# Patient Record
Sex: Female | Born: 1995 | Race: White | Hispanic: No | Marital: Single | Smoking: Never smoker
Health system: Southern US, Community
[De-identification: ages and names within clinical notes are randomized; demographics above are authoritative.]

## PROBLEM LIST (undated history)

## (undated) DIAGNOSIS — K529 Noninfective gastroenteritis and colitis, unspecified: Secondary | ICD-10-CM

## (undated) HISTORY — PX: KNEE SURGERY: SHX244

---
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
## 2016-03-11 ENCOUNTER — Other Ambulatory Visit: Payer: Self-pay | Admitting: Physician Assistant

## 2017-06-16 ENCOUNTER — Other Ambulatory Visit: Payer: Self-pay

## 2017-06-16 ENCOUNTER — Ambulatory Visit (HOSPITAL_COMMUNITY)
Admission: EM | Admit: 2017-06-16 | Discharge: 2017-06-16 | Disposition: A | Discharge status: Home / Self Care | Payer: 59 | Attending: Family Medicine | Admitting: Family Medicine

## 2017-06-16 ENCOUNTER — Ambulatory Visit (INDEPENDENT_AMBULATORY_CARE_PROVIDER_SITE_OTHER): Payer: 59

## 2017-06-16 ENCOUNTER — Encounter (HOSPITAL_COMMUNITY): Payer: Self-pay | Admitting: Emergency Medicine

## 2017-06-16 DIAGNOSIS — R05 Cough: Secondary | ICD-10-CM

## 2017-06-16 DIAGNOSIS — J069 Acute upper respiratory infection, unspecified: Secondary | ICD-10-CM | POA: Diagnosis not present

## 2017-06-16 DIAGNOSIS — R059 Cough, unspecified: Secondary | ICD-10-CM

## 2017-06-16 MED ORDER — AZITHROMYCIN 250 MG PO TABS: 250.0000 mg | ORAL_TABLET | Freq: Every day | ORAL | 0 refills | Status: DC

## 2017-06-16 NOTE — ED Triage Notes (Signed)
Symptoms for 2 weeks.  Seen by pcp, took antibiotics for 10 day.  She never felt well.  She was taking amoxicillin.  Patient finished them 2 days ago and symptoms are worsening.  Patient has congested nose, headache, ears hurting, and sore throat.

## 2017-06-18 ENCOUNTER — Other Ambulatory Visit: Payer: Self-pay

## 2017-06-18 ENCOUNTER — Ambulatory Visit: Payer: 59 | Admitting: Family Medicine

## 2017-06-18 ENCOUNTER — Encounter: Payer: Self-pay | Admitting: Family Medicine

## 2017-06-18 VITALS — BP 116/72 | HR 122 | Temp 97.6°F | Resp 16 | Ht 69.69 in | Wt 319.0 lb

## 2017-06-18 DIAGNOSIS — J310 Chronic rhinitis: Secondary | ICD-10-CM

## 2017-06-18 MED ORDER — CETIRIZINE HCL 10 MG PO TABS: 10.0000 mg | ORAL_TABLET | Freq: Every day | ORAL | 11 refills | Status: AC

## 2017-06-18 MED ORDER — PSEUDOEPHEDRINE HCL ER 120 MG PO TB12: 120.0000 mg | ORAL_TABLET | Freq: Two times a day (BID) | ORAL | 1 refills | Status: AC

## 2017-06-18 NOTE — Patient Instructions (Addendum)
1. Use flonase twice a day 2. Use zyrtec at bedtime 3. Use humidifier    IF you received an x-ray today, you will receive an invoice from [facility]. Please contact [facility] at [phone] with questions or concerns regarding your invoice.   IF you received labwork today, you will receive an invoice from [facility]. Please contact [facility] at [phone] with questions or concerns regarding your invoice.   Our billing staff will not be able to assist you with questions regarding bills from these companies.  You will be contacted with the lab results as soon as they are available. The fastest way to get your results is to activate your My Chart account. Instructions are located on the last page of this paperwork. If you have not heard from us regarding the results in 2 weeks, please contact this office.

## 2017-06-18 NOTE — Progress Notes (Signed)
   11/16/201812:59 PM  Alicia Frederick 11/02/1995, 21 y.o. female [id number]  Chief Complaint  Patient presents with  . Cough    pt states she has been having a Chronic cough for two weeks   . Nasal Congestion    nasal drainage  with some ear pain     HPI:   Patient is a 21 y.o. female who presents today for nasal congestion, PND and cough, normally worse at night for about 2 weeks that is not getting better.  She has been seen at multiple clinics/[facility] 11/1 11/2 11/6 - treated with amoxcillin for right AOM, reports ear pain completely resolved since then. 11/14 - CXR was normal  Patient reports that she tried flonase once a day for the first week. She has continued to use robitussin and mucinex without much improvement.  Depression screen PHQ 2/9 06/18/2017  Decreased Interest 0  Down, Depressed, Hopeless 0  PHQ - 2 Score 0    No Known Allergies  Prior to Admission medications   Medication Sig Start Date End Date Taking? Authorizing Provider    History reviewed. No pertinent past medical history.  Past Surgical History:  Procedure Laterality Date  . KNEE SURGERY      Social History   Tobacco Use  . Smoking status: Never Smoker  . Smokeless tobacco: Never Used  Substance Use Topics  . Alcohol use: No    Frequency: Never    History reviewed. No pertinent family history.  Review of Systems  Constitutional: Negative for chills and fever.  HENT: Positive for congestion. Negative for ear pain, sinus pain and sore throat.   Respiratory: Positive for cough. Negative for shortness of breath.   Cardiovascular: Negative for chest pain, palpitations and leg swelling.  Gastrointestinal: Negative for abdominal pain, nausea and vomiting.     OBJECTIVE:  Blood pressure 116/72, pulse (!) 122, temperature 97.6 F (36.4 C), temperature source Oral, resp. rate 16, height 5' 9.69" (1.77 m), weight (!) 319 lb (144.7 kg), SpO2 96 %.  Physical Exam  Constitutional: She is  oriented to person, place, and time and well-developed, well-nourished, and in no distress.  HENT:  Head: Normocephalic and atraumatic.  Right Ear: Hearing, tympanic membrane, external ear and ear canal normal.  Left Ear: Hearing, tympanic membrane, external ear and ear canal normal.  Mouth/Throat: Oropharynx is clear and moist.  Eyes: EOM are normal. Pupils are equal, round, and reactive to light.  Neck: Neck supple.  Cardiovascular: Normal rate, regular rhythm and normal heart sounds. Exam reveals no gallop and no friction rub.  No murmur heard. Pulmonary/Chest: Effort normal and breath sounds normal. She has no wheezes. She has no rales.  Lymphadenopathy:    She has no cervical adenopathy.  Neurological: She is alert and oriented to person, place, and time. Gait normal.  Skin: Skin is warm and dry.    ASSESSMENT and PLAN  1. Rhinitis, unspecified type Discussed supportive measures, restart flonase, new meds r/se/b and RTC precautions. Patient educational handout given. - cetirizine (ZYRTEC) 10 MG tablet; Take 1 tablet (10 mg total) daily by mouth. - pseudoephedrine (SUDAFED 12 HOUR) 120 MG 12 hr tablet; Take 1 tablet (120 mg total) 2 (two) times daily by mouth.  Return if symptoms worsen or fail to improve.    Myles LippsIrma M Santiago, MD [facility] [address] [place], [place] [address] Ph.  [phone] Fax [phone]

## 2017-06-21 NOTE — ED Provider Notes (Signed)
  [facility]   [id number] 06/16/17 Arrival Time: 1338  ASSESSMENT & PLAN:  1. Cough   2. Acute upper respiratory infection     Meds ordered this encounter  Medications  . azithromycin (ZITHROMAX) 250 MG tablet    Sig: Take 1 tablet (250 mg total) daily by mouth. Take first 2 tablets together, then 1 every day until finished.    Dispense:  6 tablet    Refill:  0    OTC symptom care as needed. May f/u if not improving over the next week.  Reviewed expectations re: course of current medical issues. Questions answered. Outlined signs and symptoms indicating need for more acute intervention. Patient verbalized understanding. After Visit Summary given.   SUBJECTIVE:  Alicia Frederick is a 21 y.o. female who presents with complaint of nasal congestion, post-nasal drainage, and a persistent cough. Onset abrupt approximately 2 weeks ago. Finished course of amoxicillin. Overall fatigued. SOB: none. Wheezing: none. Cough is bothering her the most. Non-smoker. OTC treatment: cough medication with mild help.  ROS: As per HPI.   OBJECTIVE:  Vitals:   06/16/17 1354  BP: 137/90  Pulse: (!) 115  Resp: (!) 22  Temp: 98.2 F (36.8 C)  TempSrc: Oral  SpO2: 97%     General appearance: alert; no distress HEENT: nasal congestion; clear runny nose; throat irritation secondary to post-nasal drainage Neck: supple without LAD Lungs: clear to auscultation bilaterally; active dry cough Skin: warm and dry Psychological: alert and cooperative; normal mood and affect  Dg Chest 2 View  Result Date: 06/16/2017 CLINICAL DATA:  Cough and shortness of breath EXAM: CHEST  2 VIEW COMPARISON:  None. FINDINGS: Normal heart size and mediastinal contours. No acute infiltrate or edema. No effusion or pneumothorax. No acute osseous findings. IMPRESSION: Negative chest. Electronically Signed   By: Marnee SpringJonathon  Watts M.D.   On: 06/16/2017 14:36    No Known Allergies   Social History    Socioeconomic History  . Marital status: Single    Spouse name: Not on file  . Number of children: Not on file  . Years of education: Not on file  . Highest education level: Not on file  Social Needs  . Financial resource strain: Not on file  . Food insecurity - worry: Not on file  . Food insecurity - inability: Not on file  . Transportation needs - medical: Not on file  . Transportation needs - non-medical: Not on file  Occupational History  . Not on file  Tobacco Use  . Smoking status: Never Smoker  . Smokeless tobacco: Never Used  Substance and Sexual Activity  . Alcohol use: No    Frequency: Never  . Drug use: No  . Sexual activity: Not on file  Other Topics Concern  . Not on file  Social History Narrative  . Not on file           Mardella LaymanHagler, Mona Ayars, MD 06/21/17 1120

## 2018-04-07 ENCOUNTER — Other Ambulatory Visit: Payer: Self-pay

## 2018-04-07 ENCOUNTER — Emergency Department (HOSPITAL_COMMUNITY): Payer: 59

## 2018-04-07 ENCOUNTER — Emergency Department (HOSPITAL_COMMUNITY)
Admission: EM | Admit: 2018-04-07 | Discharge: 2018-04-07 | Disposition: A | Discharge status: Home / Self Care | Payer: 59 | Attending: Emergency Medicine | Admitting: Emergency Medicine

## 2018-04-07 DIAGNOSIS — R519 Headache, unspecified: Secondary | ICD-10-CM

## 2018-04-07 DIAGNOSIS — R040 Epistaxis: Secondary | ICD-10-CM | POA: Diagnosis not present

## 2018-04-07 DIAGNOSIS — R51 Headache: Secondary | ICD-10-CM | POA: Insufficient documentation

## 2018-04-07 LAB — CBC WITH DIFFERENTIAL/PLATELET
ABS IMMATURE GRANULOCYTES: 0 10*3/uL (ref 0.0–0.1)
BASOS PCT: 1 %
Basophils Absolute: 0.1 10*3/uL (ref 0.0–0.1)
Eosinophils Absolute: 0.2 10*3/uL (ref 0.0–0.7)
Eosinophils Relative: 3 %
HEMATOCRIT: 41.1 % (ref 36.0–46.0)
HEMOGLOBIN: 13 g/dL (ref 12.0–15.0)
IMMATURE GRANULOCYTES: 0 %
LYMPHS ABS: 2.1 10*3/uL (ref 0.7–4.0)
Lymphocytes Relative: 25 %
MCH: 25.6 pg — AB (ref 26.0–34.0)
MCHC: 31.6 g/dL (ref 30.0–36.0)
MCV: 80.9 fL (ref 78.0–100.0)
MONO ABS: 0.5 10*3/uL (ref 0.1–1.0)
MONOS PCT: 6 %
NEUTROS ABS: 5.5 10*3/uL (ref 1.7–7.7)
Neutrophils Relative %: 65 %
PLATELETS: 300 10*3/uL (ref 150–400)
RBC: 5.08 MIL/uL (ref 3.87–5.11)
RDW: 13.4 % (ref 11.5–15.5)
WBC: 8.4 10*3/uL (ref 4.0–10.5)

## 2018-04-07 LAB — COMPREHENSIVE METABOLIC PANEL
ALBUMIN: 3.4 g/dL — AB (ref 3.5–5.0)
ALK PHOS: 64 U/L (ref 38–126)
ALT: 35 U/L (ref 0–44)
ANION GAP: 9 (ref 5–15)
AST: 23 U/L (ref 15–41)
BUN: 10 mg/dL (ref 6–20)
CALCIUM: 8.9 mg/dL (ref 8.9–10.3)
CHLORIDE: 107 mmol/L (ref 98–111)
CO2: 21 mmol/L — AB (ref 22–32)
CREATININE: 0.67 mg/dL (ref 0.44–1.00)
GFR calc Af Amer: >60 mL/min (ref >60)
GFR calc non Af Amer: >60 mL/min (ref >60)
GLUCOSE: 132 mg/dL — AB (ref 70–99)
Potassium: 4 mmol/L (ref 3.5–5.1)
SODIUM: 137 mmol/L (ref 135–145)
Total Bilirubin: 0.8 mg/dL (ref 0.3–1.2)
Total Protein: 6.8 g/dL (ref 6.5–8.1)

## 2018-04-07 LAB — I-STAT BETA HCG BLOOD, ED (MC, WL, AP ONLY)

## 2018-04-07 LAB — PROTIME-INR
INR: 1.09
Prothrombin Time: 14 seconds (ref 11.4–15.2)

## 2018-04-07 MED ORDER — IOPAMIDOL (ISOVUE-370) INJECTION 76%
50.0000 mL | Freq: Once | INTRAVENOUS | Status: AC | PRN
  Administered 2018-04-07: 50 mL via INTRAVENOUS

## 2018-04-07 MED ORDER — IOPAMIDOL (ISOVUE-370) INJECTION 76%
INTRAVENOUS | Status: AC
  Filled 2018-04-07: qty 50

## 2018-04-07 NOTE — ED Triage Notes (Signed)
Formatting of this note might be different from the original.  Pt states for the 3 last week she has been having intermittent nose bleeds and has seen an ENT. Pt states now she has started having headaches after it bleeds. Currently is not bleeding.   Electronically signed by Mariea Stable, RN at 04/07/2018 11:05 AM EDT

## 2018-04-07 NOTE — ED Notes (Signed)
Formatting of this note might be different from the original.  Patient transported to CT  Electronically signed by Parke Simmers, Pollyann Savoy, RN at 04/07/2018 12:44 PM EDT

## 2018-04-07 NOTE — ED Provider Notes (Signed)
Formatting of this note is different from the original.  Images from the original note were not included.    [facility] EMERGENCY DEPARTMENT  Provider Note    CSN: [id number]  Arrival date & time: 04/07/18  1054    History    Chief Complaint  Chief Complaint   Patient presents with   ? Epistaxis     HPI  Cristina Rios is a 22 y.o. female.    Cristina Rios is a 22 y.o. Female who is otherwise healthy, presents to the emergency department for evaluation of intermittent nosebleeds.  For the last 2 to 3 weeks patient has been having daily nosebleeds in the morning when she wakes up, it seems to alternate between sides.  Patient reports for the past 5 days she has begun having severe headaches after the nosebleed that lasted about an hour.  No prior history of headaches.  She reports the headache is across the front of her face and is a constant throbbing ache.  She denies associated visual changes, facial asymmetry or weakness, difficulty speaking or swallowing and no numbness weakness or tingling in her arms or legs.  No neck pain.  No fevers.  Patient has been seen by urgent care, her primary care doctor and ENT regarding these nosebleeds had lab work done initially which showed normal hemoglobin and platelet count, she has had cautery of the septum performed on 2 occasions first at urgent care and then with ENT, since then she has been able to control her nosebleeds with Afrin-soaked cotton balls but nosebleeds have persisted since cautery and she is now developed these headaches.  Attempted to call the ENT doctor she saw previously but they were unable to see her today and recommended coming to the ED for further evaluation given new symptoms.  She is not on any other new medications, no history of other bruising or bleeding issues.  She has not had any head imaging since symptoms began.    No past medical history on file.    There are no active problems to display for this patient.    Past Surgical  History:   Procedure Laterality Date   ? KNEE SURGERY         OB History    None      Home Medications      Prior to Admission medications    Medication Sig Start Date End Date Taking? Authorizing Provider   cetirizine (ZYRTEC) 10 MG tablet Take 1 tablet (10 mg total) daily by mouth. 06/18/17   Myles Lipps, MD   pseudoephedrine (SUDAFED 12 HOUR) 120 MG 12 hr tablet Take 1 tablet (120 mg total) 2 (two) times daily by mouth. 06/18/17   Myles Lipps, MD     Family History  No family history on file.    Social History  Social History     Tobacco Use   ? Smoking status: Never Smoker   ? Smokeless tobacco: Never Used   Substance Use Topics   ? Alcohol use: No     Frequency: Never   ? Drug use: No     Allergies    Patient has no known allergies.    Review of Systems  Review of Systems   Constitutional: Negative for chills and fever.   HENT: Positive for nosebleeds.    Eyes: Negative for photophobia, pain, redness and visual disturbance.   Respiratory: Negative for cough and shortness of breath.    Cardiovascular: Negative  for chest pain.   Gastrointestinal: Negative for abdominal pain, nausea and vomiting.   Musculoskeletal: Negative for neck pain and neck stiffness.   Skin: Negative for color change, pallor and wound.   Neurological: Positive for headaches. Negative for dizziness, tremors, seizures, syncope, facial asymmetry, speech difficulty, weakness, light-headedness and numbness.   Hematological: Negative for adenopathy. Does not bruise/bleed easily.   All other systems reviewed and are negative.    Physical Exam  Updated Vital Signs  BP (!) 169/77 (BP Location: Right Arm)   Pulse 92   Temp 98.7 F (37.1 C) (Oral)   Resp 17   SpO2 94%     Physical Exam   Constitutional: She is oriented to person, place, and time. She appears well-developed and well-nourished. No distress.   HENT:   Head: Normocephalic and atraumatic.   Bilateral nares are patent without significant mucosal edema, no active bleeding and  no clear area of prior bleeding.  Posterior oropharynx is clear, no bloody drainage noted.  Lateral TMs clear with good light reflection.   Eyes: Right eye exhibits no discharge. Left eye exhibits no discharge.   Neck: Neck supple.   Cardiovascular: Normal rate, regular rhythm, normal heart sounds and intact distal pulses.   Pulmonary/Chest: Effort normal and breath sounds normal. No respiratory distress.   Respirations equal and unlabored, patient able to speak in full sentences, lungs clear to auscultation bilaterally   Musculoskeletal: She exhibits no edema or deformity.   Lymphadenopathy:     She has no cervical adenopathy.   Neurological: She is alert and oriented to person, place, and time. Coordination normal.   Speech is clear, able to follow commands  CN III-XII intact  Normal strength in upper and lower extremities bilaterally including dorsiflexion and plantar flexion, strong and equal grip strength  Sensation normal to light and sharp touch  Moves extremities without ataxia, coordination intact  Normal finger to nose and rapid alternating movements  No pronator drift   Skin: Skin is warm and dry. Capillary refill takes less than 2 seconds. She is not diaphoretic.   Psychiatric: She has a normal mood and affect. Her behavior is normal.   Nursing note and vitals reviewed.    ED Treatments / Results   Labs  (all labs ordered are listed, but only abnormal results are displayed)  Labs Reviewed   COMPREHENSIVE METABOLIC PANEL - Abnormal; Notable for the following components:       Result Value    CO2 21 (*)     Glucose, Bld 132 (*)     Albumin 3.4 (*)     All other components within normal limits   CBC WITH DIFFERENTIAL/PLATELET - Abnormal; Notable for the following components:    MCH 25.6 (*)     All other components within normal limits   PROTIME-INR   I-STAT BETA HCG BLOOD, ED ([facility], [facility], [facility] ONLY)     EKG  None    Radiology  Ct Angio Head W Or Wo Contrast    Result Date: 04/07/2018  CLINICAL DATA:  22 year old  female with recurrent epistaxis despite two nasal septal cauterizations recently. Daily nose bleeds now and new onset associated headache. EXAM: CT ANGIOGRAPHY HEAD AND NECK TECHNIQUE: Multidetector CT imaging of the head and neck was performed using the standard protocol during bolus administration of intravenous contrast. Multiplanar CT image reconstructions and MIPs were obtained to evaluate the vascular anatomy. Carotid stenosis measurements (when applicable) are obtained utilizing NASCET criteria, using the distal internal  carotid diameter as the denominator. CONTRAST:  50mL ISOVUE-370 IOPAMIDOL (ISOVUE-370) INJECTION 76% COMPARISON:  None. FINDINGS: CT HEAD Brain: No midline shift, ventriculomegaly, mass effect, evidence of mass lesion, intracranial hemorrhage or evidence of cortically based acute infarction. Gray-white matter differentiation is within normal limits throughout the brain. Normal cerebral volume. Calvarium and skull base: Intact. Paranasal sinuses: Mild bilateral maxillary sinus and anterior ethmoid mucosal thickening, otherwise clear. No sinus fluid levels. Orbits: Visualized orbits and scalp soft tissues are within normal limits. CTA NECK Skeleton: Negative. Upper chest: Large body habitus. Negative upper lungs aside from mild atelectasis. Negative visible mediastinum. Other neck: Negative thyroid, larynx, pharynx, parapharyngeal spaces, retropharyngeal space, sublingual space, submandibular and parotid spaces. The masticator spaces likewise appear normal. Bilateral cervical lymph nodes are within normal limits. The nasal septum is intact with mild rightward septal deviation. No septal mass or lesion is evident. There is mild symmetric appearing bilateral nasal cavity mucosal thickening with otherwise normal nasal cavity pneumatization. No abnormal nasal cavity enhancement, no abnormal nasal or facial vessels are identified. The bilateral pterygoid palatine fossa appear symmetric and normal.  Incidental left nose piercing involving the piriform aperture, not the septum. Aortic arch: Normal 3 vessel arch configuration. No atherosclerosis or great vessel origin stenosis. Right carotid system: Negative aside from tortuosity of the right ICA at the C2 level. The right ECA branches appear within normal limits. Left carotid system: Negative aside from mild left ICA tortuosity at the C2 level. The left ECA branches appear within normal limits. Vertebral arteries: Normal proximal right subclavian artery and right vertebral artery origin. Normal right vertebral artery to the skull base. Normal proximal left subclavian artery and left vertebral artery origin. The left vertebral is mildly non dominant and normal to the skull base. CTA HEAD Posterior circulation: Normal distal vertebral arteries and vertebrobasilar junction. Normal left PICA origin, the right AICA appears dominant and patent. Patent basilar artery without stenosis. Normal SCA and PCA origins. Small posterior communicating arteries are identified. Bilateral PCA branches are within normal limits. Anterior circulation: Both ICA siphons are patent and appear normal. Normal ophthalmic and posterior communicating artery origins. Normal carotid termini, MCA and ACA origins. Anterior communicating artery and bilateral ACA branches are within normal limits. There is a median artery of the corpus callosum (normal variant). Left MCA M1 segment, trifurcation, and left MCA branches are within normal limits. Right MCA M1 segment, bifurcation, and right MCA branches are within normal limits. Venous sinuses: Patent. Anatomic variants: Minimally dominant right vertebral artery and median artery of the corpus callosum. Delayed phase: No abnormal intracranial enhancement. No abnormal enhancement about the face or nasal cavity. Review of the MIP images confirms the above findings IMPRESSION: Negative CTA head and neck. No arterial abnormality, evidence of vascular  malformation, nasal cavity mass, or explanation for recurrent epistaxis. Electronically Signed   By: Odessa Fleming M.D.   On: 04/07/2018 13:43     Ct Angio Neck W And/or Wo Contrast    Result Date: 04/07/2018  CLINICAL DATA:  22 year old female with recurrent epistaxis despite two nasal septal cauterizations recently. Daily nose bleeds now and new onset associated headache. EXAM: CT ANGIOGRAPHY HEAD AND NECK TECHNIQUE: Multidetector CT imaging of the head and neck was performed using the standard protocol during bolus administration of intravenous contrast. Multiplanar CT image reconstructions and MIPs were obtained to evaluate the vascular anatomy. Carotid stenosis measurements (when applicable) are obtained utilizing NASCET criteria, using the distal internal carotid diameter as the denominator. CONTRAST:  50mL  ISOVUE-370 IOPAMIDOL (ISOVUE-370) INJECTION 76% COMPARISON:  None. FINDINGS: CT HEAD Brain: No midline shift, ventriculomegaly, mass effect, evidence of mass lesion, intracranial hemorrhage or evidence of cortically based acute infarction. Gray-white matter differentiation is within normal limits throughout the brain. Normal cerebral volume. Calvarium and skull base: Intact. Paranasal sinuses: Mild bilateral maxillary sinus and anterior ethmoid mucosal thickening, otherwise clear. No sinus fluid levels. Orbits: Visualized orbits and scalp soft tissues are within normal limits. CTA NECK Skeleton: Negative. Upper chest: Large body habitus. Negative upper lungs aside from mild atelectasis. Negative visible mediastinum. Other neck: Negative thyroid, larynx, pharynx, parapharyngeal spaces, retropharyngeal space, sublingual space, submandibular and parotid spaces. The masticator spaces likewise appear normal. Bilateral cervical lymph nodes are within normal limits. The nasal septum is intact with mild rightward septal deviation. No septal mass or lesion is evident. There is mild symmetric appearing bilateral nasal cavity  mucosal thickening with otherwise normal nasal cavity pneumatization. No abnormal nasal cavity enhancement, no abnormal nasal or facial vessels are identified. The bilateral pterygoid palatine fossa appear symmetric and normal. Incidental left nose piercing involving the piriform aperture, not the septum. Aortic arch: Normal 3 vessel arch configuration. No atherosclerosis or great vessel origin stenosis. Right carotid system: Negative aside from tortuosity of the right ICA at the C2 level. The right ECA branches appear within normal limits. Left carotid system: Negative aside from mild left ICA tortuosity at the C2 level. The left ECA branches appear within normal limits. Vertebral arteries: Normal proximal right subclavian artery and right vertebral artery origin. Normal right vertebral artery to the skull base. Normal proximal left subclavian artery and left vertebral artery origin. The left vertebral is mildly non dominant and normal to the skull base. CTA HEAD Posterior circulation: Normal distal vertebral arteries and vertebrobasilar junction. Normal left PICA origin, the right AICA appears dominant and patent. Patent basilar artery without stenosis. Normal SCA and PCA origins. Small posterior communicating arteries are identified. Bilateral PCA branches are within normal limits. Anterior circulation: Both ICA siphons are patent and appear normal. Normal ophthalmic and posterior communicating artery origins. Normal carotid termini, MCA and ACA origins. Anterior communicating artery and bilateral ACA branches are within normal limits. There is a median artery of the corpus callosum (normal variant). Left MCA M1 segment, trifurcation, and left MCA branches are within normal limits. Right MCA M1 segment, bifurcation, and right MCA branches are within normal limits. Venous sinuses: Patent. Anatomic variants: Minimally dominant right vertebral artery and median artery of the corpus callosum. Delayed phase: No  abnormal intracranial enhancement. No abnormal enhancement about the face or nasal cavity. Review of the MIP images confirms the above findings IMPRESSION: Negative CTA head and neck. No arterial abnormality, evidence of vascular malformation, nasal cavity mass, or explanation for recurrent epistaxis. Electronically Signed   By: Odessa Fleming M.D.   On: 04/07/2018 13:43     Procedures  Procedures (including critical care time)    Medications Ordered in ED  Medications   iopamidol (ISOVUE-370) 76 % injection (has no administration in time range)   iopamidol (ISOVUE-370) 76 % injection 50 mL (50 mLs Intravenous Contrast Given 04/07/18 1249)     Initial Impression / Assessment and Plan / ED Course   I have reviewed the triage vital signs and the nursing notes.    Pertinent labs & imaging results that were available during my care of the patient were reviewed by me and considered in my medical decision making (see chart for details).    Presents  with recurrent epistaxis for the last 2 to 3 weeks every morning when she wakes up she has large-volume nosebleeds.  Patient is now experiencing severe headache after each nosebleed for the past 4 days.  She has been evaluated by urgent care, primary care and ENT prior to headaches beginning.  Has had 2 different cautery procedures done but patient continues to have bleeding.  She has been able to control bleeding at home over the past few days using cotton ball soaked in Afrin.  No active bleeding on arrival.  Patient mildly hypertensive and this resolved without intervention, all other vitals normal.  She does not currently have a headache.  Normal neurologic exam, no neck rigidity, no fevers.  Bilateral nares are patent without evidence of active bleeding or obvious area to cauterize.  The patient is now developing severe headaches after nosebleeds this raises concern for some sort of intracranial or vascular issue.  Discussed with Dr. Rush Landmark and with Dr. Margo Aye with radiology we will  get basic labs, pregnancy and PT/INR and will get CTA of the head and neck to assess for any AVM or aneurysm.    Labs very reassuring, normal hemoglobin despite bleeding, no leukocytosis and normal platelet count.  PT/INR within normal range.  No acute electrolyte derangements.  Normal renal and liver function.    CTA of the head and neck shows no arterial abnormality or vascular malformation, special attention was paid to the nasal cavities which showed no mass or explanation for recurrent epistaxis.  Patient has had no further nose bleeding while here in the ED and remains headache free.  I feel she is stable for continued outpatient follow-up with the ENT and her primary care doctor, will have her continue to manage nosebleeds as instructed by ENT.  Return precautions discussed.  Patient expresses understanding and is in agreement with plan.    Case discussed with Dr. Rush Landmark who is in agreement with plan.    Final Clinical Impressions(s) / ED Diagnoses     Final diagnoses:   Epistaxis, recurrent   Acute nonintractable headache, unspecified headache type     ED Discharge Orders     None         Legrand Rams  04/07/18 1715      Tegeler, Canary Brim, MD  04/07/18 2031    Electronically signed by Tegeler, Canary Brim, MD at 04/07/2018  8:31 PM EDT    Associated attestation - Tegeler, Canary Brim, MD - 04/07/2018  8:31 PM EDT  Formatting of this note might be different from the original.  Attestation: Medical screening examination/treatment/procedure(s) were performed by non-physician practitioner and as supervising physician I was immediately available for consultation/collaboration.    EKG: None

## 2018-04-07 NOTE — ED Notes (Signed)
Formatting of this note might be different from the original.  Patient verbalizes understanding of discharge instructions. Opportunity for questioning and answers were provided. Pt discharged from ED.  Electronically signed by Parke Simmers, Pollyann Savoy, RN at 04/07/2018  2:46 PM EDT

## 2018-04-07 NOTE — ED Notes (Signed)
Patient transported to CT 

## 2018-04-07 NOTE — ED Provider Notes (Signed)
[facility] EMERGENCY DEPARTMENT Provider Note   CSN: [id number] Arrival date & time: 04/07/18  1054     History   Chief Complaint Chief Complaint  Patient presents with  . Epistaxis    HPI Alicia Frederick is a 22 y.o. female.  Alicia Frederick is a 22 y.o. Female who is otherwise healthy, presents to the emergency department for evaluation of intermittent nosebleeds.  For the last 2 to 3 weeks patient has been having daily nosebleeds in the morning when she wakes up, it seems to alternate between sides.  Patient reports for the past 5 days she has begun having severe headaches after the nosebleed that lasted about an hour.  No prior history of headaches.  She reports the headache is across the front of her face and is a constant throbbing ache.  She denies associated visual changes, facial asymmetry or weakness, difficulty speaking or swallowing and no numbness weakness or tingling in her arms or legs.  No neck pain.  No fevers.  Patient has been seen by urgent care, her primary care doctor and ENT regarding these nosebleeds had lab work done initially which showed normal hemoglobin and platelet count, she has had cautery of the septum performed on 2 occasions first at urgent care and then with ENT, since then she has been able to control her nosebleeds with Afrin-soaked cotton balls but nosebleeds have persisted since cautery and she is now developed these headaches.  Attempted to call the ENT doctor she saw previously but they were unable to see her today and recommended coming to the ED for further evaluation given new symptoms.  She is not on any other new medications, no history of other bruising or bleeding issues.  She has not had any head imaging since symptoms began.     No past medical history on file.  There are no active problems to display for this patient.   Past Surgical History:  Procedure Laterality Date  . KNEE SURGERY       OB History   None       Home Medications    Prior to Admission medications   Medication Sig Start Date End Date Taking? Authorizing Provider  cetirizine (ZYRTEC) 10 MG tablet Take 1 tablet (10 mg total) daily by mouth. 06/18/17   Myles Lipps, MD  pseudoephedrine (SUDAFED 12 HOUR) 120 MG 12 hr tablet Take 1 tablet (120 mg total) 2 (two) times daily by mouth. 06/18/17   Myles Lipps, MD    Family History No family history on file.  Social History Social History   Tobacco Use  . Smoking status: Never Smoker  . Smokeless tobacco: Never Used  Substance Use Topics  . Alcohol use: No    Frequency: Never  . Drug use: No     Allergies   Patient has no known allergies.   Review of Systems Review of Systems  Constitutional: Negative for chills and fever.  HENT: Positive for nosebleeds.   Eyes: Negative for photophobia, pain, redness and visual disturbance.  Respiratory: Negative for cough and shortness of breath.   Cardiovascular: Negative for chest pain.  Gastrointestinal: Negative for abdominal pain, nausea and vomiting.  Musculoskeletal: Negative for neck pain and neck stiffness.  Skin: Negative for color change, pallor and wound.  Neurological: Positive for headaches. Negative for dizziness, tremors, seizures, syncope, facial asymmetry, speech difficulty, weakness, light-headedness and numbness.  Hematological: Negative for adenopathy. Does not bruise/bleed easily.  All other systems reviewed and  are negative.    Physical Exam Updated Vital Signs BP (!) 169/77 (BP Location: Right Arm)   Pulse 92   Temp 98.7 F (37.1 C) (Oral)   Resp 17   SpO2 94%   Physical Exam  Constitutional: She is oriented to person, place, and time. She appears well-developed and well-nourished. No distress.  HENT:  Head: Normocephalic and atraumatic.  Bilateral nares are patent without significant mucosal edema, no active bleeding and no clear area of prior bleeding. Posterior oropharynx is clear,  no bloody drainage noted. Lateral TMs clear with good light reflection.  Eyes: Right eye exhibits no discharge. Left eye exhibits no discharge.  Neck: Neck supple.  Cardiovascular: Normal rate, regular rhythm, normal heart sounds and intact distal pulses.  Pulmonary/Chest: Effort normal and breath sounds normal. No respiratory distress.  Respirations equal and unlabored, patient able to speak in full sentences, lungs clear to auscultation bilaterally  Musculoskeletal: She exhibits no edema or deformity.  Lymphadenopathy:    She has no cervical adenopathy.  Neurological: She is alert and oriented to person, place, and time. Coordination normal.  Speech is clear, able to follow commands CN III-XII intact Normal strength in upper and lower extremities bilaterally including dorsiflexion and plantar flexion, strong and equal grip strength Sensation normal to light and sharp touch Moves extremities without ataxia, coordination intact Normal finger to nose and rapid alternating movements No pronator drift  Skin: Skin is warm and dry. Capillary refill takes less than 2 seconds. She is not diaphoretic.  Psychiatric: She has a normal mood and affect. Her behavior is normal.  Nursing note and vitals reviewed.    ED Treatments / Results  Labs (all labs ordered are listed, but only abnormal results are displayed) Labs Reviewed  COMPREHENSIVE METABOLIC PANEL - Abnormal; Notable for the following components:      Result Value   CO2 21 (*)    Glucose, Bld 132 (*)    Albumin 3.4 (*)    All other components within normal limits  CBC WITH DIFFERENTIAL/PLATELET - Abnormal; Notable for the following components:   MCH 25.6 (*)    All other components within normal limits  PROTIME-INR  I-STAT BETA HCG BLOOD, ED ([facility], [facility], [facility] ONLY)    EKG None  Radiology Ct Angio Head W Or Wo Contrast  Result Date: 04/07/2018 CLINICAL DATA:  22 year old female with recurrent epistaxis despite two nasal septal  cauterizations recently. Daily nose bleeds now and new onset associated headache. EXAM: CT ANGIOGRAPHY HEAD AND NECK TECHNIQUE: Multidetector CT imaging of the head and neck was performed using the standard protocol during bolus administration of intravenous contrast. Multiplanar CT image reconstructions and MIPs were obtained to evaluate the vascular anatomy. Carotid stenosis measurements (when applicable) are obtained utilizing NASCET criteria, using the distal internal carotid diameter as the denominator. CONTRAST:  50mL ISOVUE-370 IOPAMIDOL (ISOVUE-370) INJECTION 76% COMPARISON:  None. FINDINGS: CT HEAD Brain: No midline shift, ventriculomegaly, mass effect, evidence of mass lesion, intracranial hemorrhage or evidence of cortically based acute infarction. Gray-white matter differentiation is within normal limits throughout the brain. Normal cerebral volume. Calvarium and skull base: Intact. Paranasal sinuses: Mild bilateral maxillary sinus and anterior ethmoid mucosal thickening, otherwise clear. No sinus fluid levels. Orbits: Visualized orbits and scalp soft tissues are within normal limits. CTA NECK Skeleton: Negative. Upper chest: Large body habitus. Negative upper lungs aside from mild atelectasis. Negative visible mediastinum. Other neck: Negative thyroid, larynx, pharynx, parapharyngeal spaces, retropharyngeal space, sublingual space, submandibular and parotid spaces. The  masticator spaces likewise appear normal. Bilateral cervical lymph nodes are within normal limits. The nasal septum is intact with mild rightward septal deviation. No septal mass or lesion is evident. There is mild symmetric appearing bilateral nasal cavity mucosal thickening with otherwise normal nasal cavity pneumatization. No abnormal nasal cavity enhancement, no abnormal nasal or facial vessels are identified. The bilateral pterygoid palatine fossa appear symmetric and normal. Incidental left nose piercing involving the piriform  aperture, not the septum. Aortic arch: Normal 3 vessel arch configuration. No atherosclerosis or great vessel origin stenosis. Right carotid system: Negative aside from tortuosity of the right ICA at the C2 level. The right ECA branches appear within normal limits. Left carotid system: Negative aside from mild left ICA tortuosity at the C2 level. The left ECA branches appear within normal limits. Vertebral arteries: Normal proximal right subclavian artery and right vertebral artery origin. Normal right vertebral artery to the skull base. Normal proximal left subclavian artery and left vertebral artery origin. The left vertebral is mildly non dominant and normal to the skull base. CTA HEAD Posterior circulation: Normal distal vertebral arteries and vertebrobasilar junction. Normal left PICA origin, the right AICA appears dominant and patent. Patent basilar artery without stenosis. Normal SCA and PCA origins. Small posterior communicating arteries are identified. Bilateral PCA branches are within normal limits. Anterior circulation: Both ICA siphons are patent and appear normal. Normal ophthalmic and posterior communicating artery origins. Normal carotid termini, MCA and ACA origins. Anterior communicating artery and bilateral ACA branches are within normal limits. There is a median artery of the corpus callosum (normal variant). Left MCA M1 segment, trifurcation, and left MCA branches are within normal limits. Right MCA M1 segment, bifurcation, and right MCA branches are within normal limits. Venous sinuses: Patent. Anatomic variants: Minimally dominant right vertebral artery and median artery of the corpus callosum. Delayed phase: No abnormal intracranial enhancement. No abnormal enhancement about the face or nasal cavity. Review of the MIP images confirms the above findings IMPRESSION: Negative CTA head and neck. No arterial abnormality, evidence of vascular malformation, nasal cavity mass, or explanation for  recurrent epistaxis. Electronically Signed   By: Odessa Fleming M.D.   On: 04/07/2018 13:43   Ct Angio Neck W And/or Wo Contrast  Result Date: 04/07/2018 CLINICAL DATA:  22 year old female with recurrent epistaxis despite two nasal septal cauterizations recently. Daily nose bleeds now and new onset associated headache. EXAM: CT ANGIOGRAPHY HEAD AND NECK TECHNIQUE: Multidetector CT imaging of the head and neck was performed using the standard protocol during bolus administration of intravenous contrast. Multiplanar CT image reconstructions and MIPs were obtained to evaluate the vascular anatomy. Carotid stenosis measurements (when applicable) are obtained utilizing NASCET criteria, using the distal internal carotid diameter as the denominator. CONTRAST:  81mL ISOVUE-370 IOPAMIDOL (ISOVUE-370) INJECTION 76% COMPARISON:  None. FINDINGS: CT HEAD Brain: No midline shift, ventriculomegaly, mass effect, evidence of mass lesion, intracranial hemorrhage or evidence of cortically based acute infarction. Gray-white matter differentiation is within normal limits throughout the brain. Normal cerebral volume. Calvarium and skull base: Intact. Paranasal sinuses: Mild bilateral maxillary sinus and anterior ethmoid mucosal thickening, otherwise clear. No sinus fluid levels. Orbits: Visualized orbits and scalp soft tissues are within normal limits. CTA NECK Skeleton: Negative. Upper chest: Large body habitus. Negative upper lungs aside from mild atelectasis. Negative visible mediastinum. Other neck: Negative thyroid, larynx, pharynx, parapharyngeal spaces, retropharyngeal space, sublingual space, submandibular and parotid spaces. The masticator spaces likewise appear normal. Bilateral cervical lymph nodes are within normal limits.  The nasal septum is intact with mild rightward septal deviation. No septal mass or lesion is evident. There is mild symmetric appearing bilateral nasal cavity mucosal thickening with otherwise normal nasal  cavity pneumatization. No abnormal nasal cavity enhancement, no abnormal nasal or facial vessels are identified. The bilateral pterygoid palatine fossa appear symmetric and normal. Incidental left nose piercing involving the piriform aperture, not the septum. Aortic arch: Normal 3 vessel arch configuration. No atherosclerosis or great vessel origin stenosis. Right carotid system: Negative aside from tortuosity of the right ICA at the C2 level. The right ECA branches appear within normal limits. Left carotid system: Negative aside from mild left ICA tortuosity at the C2 level. The left ECA branches appear within normal limits. Vertebral arteries: Normal proximal right subclavian artery and right vertebral artery origin. Normal right vertebral artery to the skull base. Normal proximal left subclavian artery and left vertebral artery origin. The left vertebral is mildly non dominant and normal to the skull base. CTA HEAD Posterior circulation: Normal distal vertebral arteries and vertebrobasilar junction. Normal left PICA origin, the right AICA appears dominant and patent. Patent basilar artery without stenosis. Normal SCA and PCA origins. Small posterior communicating arteries are identified. Bilateral PCA branches are within normal limits. Anterior circulation: Both ICA siphons are patent and appear normal. Normal ophthalmic and posterior communicating artery origins. Normal carotid termini, MCA and ACA origins. Anterior communicating artery and bilateral ACA branches are within normal limits. There is a median artery of the corpus callosum (normal variant). Left MCA M1 segment, trifurcation, and left MCA branches are within normal limits. Right MCA M1 segment, bifurcation, and right MCA branches are within normal limits. Venous sinuses: Patent. Anatomic variants: Minimally dominant right vertebral artery and median artery of the corpus callosum. Delayed phase: No abnormal intracranial enhancement. No abnormal  enhancement about the face or nasal cavity. Review of the MIP images confirms the above findings IMPRESSION: Negative CTA head and neck. No arterial abnormality, evidence of vascular malformation, nasal cavity mass, or explanation for recurrent epistaxis. Electronically Signed   By: Odessa Fleming M.D.   On: 04/07/2018 13:43    Procedures Procedures (including critical care time)  Medications Ordered in ED Medications  iopamidol (ISOVUE-370) 76 % injection (has no administration in time range)  iopamidol (ISOVUE-370) 76 % injection 50 mL (50 mLs Intravenous Contrast Given 04/07/18 1249)     Initial Impression / Assessment and Plan / ED Course  I have reviewed the triage vital signs and the nursing notes.  Pertinent labs & imaging results that were available during my care of the patient were reviewed by me and considered in my medical decision making (see chart for details).  Presents with recurrent epistaxis for the last 2 to 3 weeks every morning when she wakes up she has large-volume nosebleeds.  Patient is now experiencing severe headache after each nosebleed for the past 4 days.  She has been evaluated by urgent care, primary care and ENT prior to headaches beginning.  Has had 2 different cautery procedures done but patient continues to have bleeding.  She has been able to control bleeding at home over the past few days using cotton ball soaked in Afrin.  No active bleeding on arrival.  Patient mildly hypertensive and this resolved without intervention, all other vitals normal.  She does not currently have a headache.  Normal neurologic exam, no neck rigidity, no fevers.  Bilateral nares are patent without evidence of active bleeding or obvious area to cauterize.  The patient is now developing severe headaches after nosebleeds this raises concern for some sort of intracranial or vascular issue.  Discussed with Dr. Rush Landmark and with Dr. Margo Aye with radiology we will get basic labs, pregnancy and PT/INR and  will get CTA of the head and neck to assess for any AVM or aneurysm.  Labs very reassuring, normal hemoglobin despite bleeding, no leukocytosis and normal platelet count.  PT/INR within normal range.  No acute electrolyte derangements.  Normal renal and liver function.  CTA of the head and neck shows no arterial abnormality or vascular malformation, special attention was paid to the nasal cavities which showed no mass or explanation for recurrent epistaxis.  Patient has had no further nose bleeding while here in the ED and remains headache free.  I feel she is stable for continued outpatient follow-up with the ENT and her primary care doctor, will have her continue to manage nosebleeds as instructed by ENT.  Return precautions discussed.  Patient expresses understanding and is in agreement with plan.  Case discussed with Dr. Rush Landmark who is in agreement with plan.  Final Clinical Impressions(s) / ED Diagnoses   Final diagnoses:  Epistaxis, recurrent  Acute nonintractable headache, unspecified headache type    ED Discharge Orders    None       Dartha Lodge, [place] 04/07/18 1715    Tegeler, Canary Brim, MD 04/07/18 2031

## 2018-04-07 NOTE — Discharge Instructions (Signed)
Your labs and CT imaging of your head and neck today were very reassuring.  No evidence of an abnormality within the brain tissue or vasculature that could be causing your symptoms, and labs show normal hemoglobin and platelet count.  Please continue managing nose bleeds as directed by ENT and please follow-up with them in your primary care doctor you may use Tylenol as needed for headaches but please avoid using NSAIDs or aspirin.

## 2018-04-07 NOTE — ED Notes (Signed)
Patient verbalizes understanding of discharge instructions. Opportunity for questioning and answers were provided. Pt discharged from ED. 

## 2018-04-07 NOTE — ED Triage Notes (Signed)
Pt states for the 3 last week she has been having intermittent nose bleeds and has seen an ENT. Pt states now she has started having headaches after it bleeds. Currently is not bleeding.

## 2019-12-08 IMAGING — CT CT ANGIO HEAD
1 of 12 series · 5 of 33 positions shown · IV contrast (iopamidol)
Comparison: None.

CLINICAL DATA: 22-year-old female with recurrent epistaxis despite
two nasal septal cauterizations recently. Daily nose bleeds now and
new onset associated headache.

EXAM:
CT ANGIOGRAPHY HEAD AND NECK
TECHNIQUE: Multidetector CT imaging of the head and neck was performed using
the standard protocol during bolus administration of intravenous
contrast. Multiplanar CT image reconstructions and MIPs were
obtained to evaluate the vascular anatomy. Carotid stenosis
measurements (when applicable) are obtained utilizing NASCET
criteria, using the distal internal carotid diameter as the
denominator.
CONTRAST:  50mL [id number] IOPAMIDOL ([id number]) INJECTION 76%

[Series 15: cta neck axial · axial · 0.39mm/px · z∈[-279,-40]mm · 5 of 359 slices shown]
[im 60/359  soft-tissue]
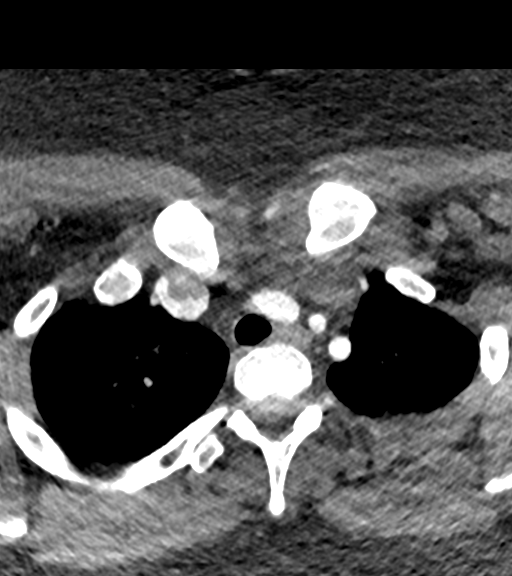
[im 120/359  bone]
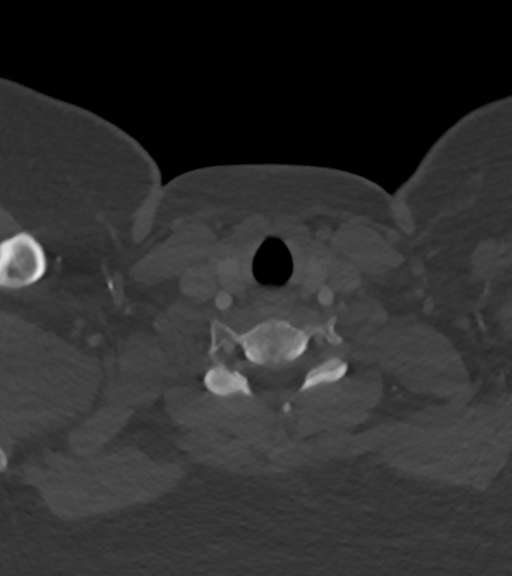
[im 180/359  soft-tissue]
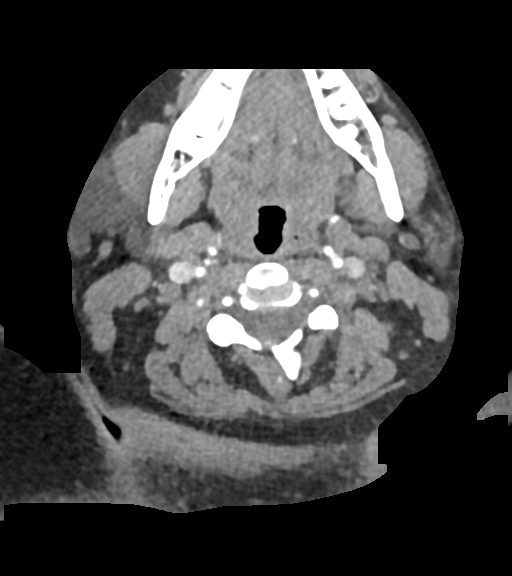
[im 239/359  bone]
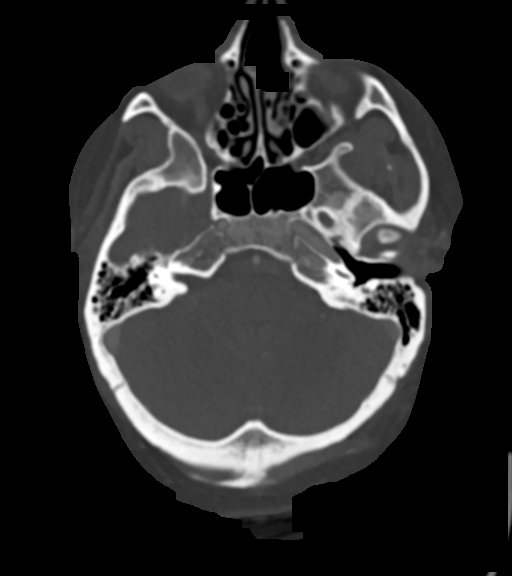
[im 299/359  soft-tissue]
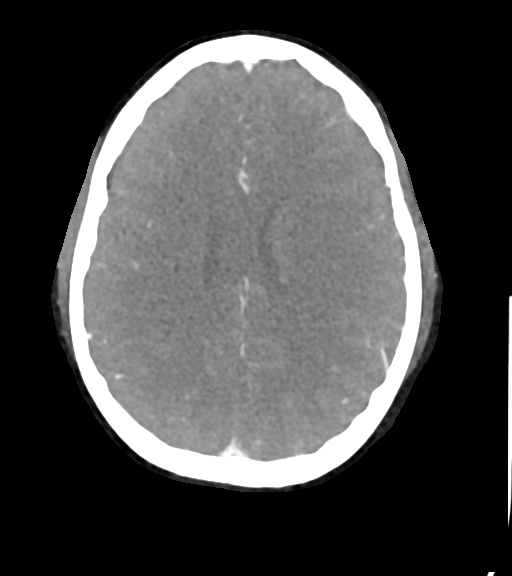

[5 of 33 positions shown; findings below may reference images not displayed]

FINDINGS: CT HEAD

Brain: No midline shift, ventriculomegaly, mass effect, evidence of
mass lesion, intracranial hemorrhage or evidence of cortically based
acute infarction. Gray-white matter differentiation is within normal
limits throughout the brain. Normal cerebral volume.

Calvarium and skull base: Intact.

Paranasal sinuses: Mild bilateral maxillary sinus and anterior
ethmoid mucosal thickening, otherwise clear. No sinus fluid levels.

Orbits: Visualized orbits and scalp soft tissues are within normal
limits.

CTA NECK

Skeleton: Negative.

Upper chest: Large body habitus. Negative upper lungs aside from
mild atelectasis. Negative visible mediastinum.

Other neck: Negative thyroid, larynx, pharynx, parapharyngeal
spaces, retropharyngeal space, sublingual space, submandibular and
parotid spaces. The masticator spaces likewise appear normal.

Bilateral cervical lymph nodes are within normal limits.

The nasal septum is intact with mild rightward septal deviation. No
septal mass or lesion is evident. There is mild symmetric appearing
bilateral nasal cavity mucosal thickening with otherwise normal
nasal cavity pneumatization. No abnormal nasal cavity enhancement,
no abnormal nasal or facial vessels are identified. The bilateral
pterygoid palatine fossa appear symmetric and normal.

Incidental left nose piercing involving the piriform aperture, not
the septum.

Aortic arch: Normal 3 vessel arch configuration. No atherosclerosis
or great vessel origin stenosis.

Right carotid system: Negative aside from tortuosity of the right
ICA at the C2 level. The right ECA branches appear within normal
limits.

Left carotid system: Negative aside from mild left ICA tortuosity at
the C2 level. The left ECA branches appear within normal limits.

Vertebral arteries:
Normal proximal right subclavian artery and right vertebral artery
origin. Normal right vertebral artery to the skull base.

Normal proximal left subclavian artery and left vertebral artery
origin. The left vertebral is mildly non dominant and normal to the
skull base.

CTA HEAD

Posterior circulation: Normal distal vertebral arteries and
vertebrobasilar junction. Normal left PICA origin, the right AICA
appears dominant and patent. Patent basilar artery without stenosis.
Normal SCA and PCA origins. Small posterior communicating arteries
are identified. Bilateral PCA branches are within normal limits.

Anterior circulation: Both ICA siphons are patent and appear normal.
Normal ophthalmic and posterior communicating artery origins. Normal
carotid termini, MCA and ACA origins. Anterior communicating artery
and bilateral ACA branches are within normal limits. There is a
median artery of the corpus callosum (normal variant). Left MCA M1
segment, trifurcation, and left MCA branches are within normal
limits. Right MCA M1 segment, bifurcation, and right MCA branches
are within normal limits.

Venous sinuses: Patent.

Anatomic variants: Minimally dominant right vertebral artery and
median artery of the corpus callosum.

Delayed phase: No abnormal intracranial enhancement. No abnormal
enhancement about the face or nasal cavity.

Review of the MIP images confirms the above findings
IMPRESSION: Negative CTA head and neck.

No arterial abnormality, evidence of vascular malformation, nasal
cavity mass, or explanation for recurrent epistaxis.

## 2021-09-21 ENCOUNTER — Emergency Department

## 2021-09-21 DIAGNOSIS — K529 Noninfective gastroenteritis and colitis, unspecified: Secondary | ICD-10-CM

## 2021-09-21 NOTE — ED Provider Notes (Signed)
EMERGENCY DEPARTMENT HISTORY AND PHYSICAL EXAM      Date: 09/21/2021  Patient Name: Cristina Rios    History of Presenting Illness     HPI: Konnor Jorden, 26 y.o. female with past medical history of obesity, history of gastric sleeve, status post cholecystectomy, known celiac gluten sensitivity, anxiety, presents to the ED with cc of 2 to 3-day history of nausea, vomiting, diarrhea with decreased p.o. intake. Patient denies any associated abdominal pain, fevers or chills.  She has no urinary complaints.  Patient reports noticing color of her stool has turned "black" over the last several days.  She denies prior history of GI bleeding.  No history of GERD/gastritis/PUD.  She denies excessive NSAID use.  No heartburn/acid reflux symptoms recently.  Denies anticoagulant use.  No lightheadedness/dizziness/shortness of breath.  No iron pill use, or other potentially dark-colored food ingestion that she can think of.  Patient reports gastric sleeve surgery was 3 years ago.    PCP: Other, Phys, MD    No current facility-administered medications on file prior to encounter.     Current Outpatient Medications on File Prior to Encounter   Medication Sig Dispense Refill    venlafaxine (EFFEXOR) 75 mg tablet Take 75 mg by mouth.         Past History     Past Medical History:  No past medical history on file.    Past Surgical History:  Past Surgical History:   Procedure Laterality Date    HX APPENDECTOMY N/A     HX CHOLECYSTECTOMY N/A        Family History:  No family history on file.    Social History:       Allergies:  No Known Allergies      Review of Systems   Review of Systems   All other systems reviewed and are negative.    Physical Exam   Physical Exam  Vitals and nursing note reviewed. Exam conducted with a chaperone present.   Constitutional:       General: She is not in acute distress.     Appearance: She is not ill-appearing or toxic-appearing.   HENT:      Head: Normocephalic and atraumatic.      Nose: Nose normal.       Mouth/Throat:      Mouth: Mucous membranes are moist.   Eyes:      Extraocular Movements: Extraocular movements intact.      Pupils: Pupils are equal, round, and reactive to light.   Cardiovascular:      Rate and Rhythm: Normal rate and regular rhythm.      Pulses: Normal pulses.   Pulmonary:      Effort: Pulmonary effort is normal.      Breath sounds: No stridor. No wheezing or rhonchi.   Abdominal:      General: Abdomen is flat. There is no distension.      Palpations: Abdomen is soft.      Tenderness: There is no abdominal tenderness.   Genitourinary:     Rectum: Normal. Guaiac result negative. No tenderness, anal fissure or external hemorrhoid. Normal anal tone.      Comments: No gross melena or bright red blood per rectum.  DRE with dark brown stool--fecal occult card sent to lab, results below.  Musculoskeletal:         General: Normal range of motion.      Cervical back: Normal range of motion and neck supple.   Skin:  General: Skin is warm and dry.   Neurological:      General: No focal deficit present.      Mental Status: She is alert and oriented to person, place, and time.   Psychiatric:         Judgment: Judgment normal.       Diagnostic Study Results     Labs -     Recent Results (from the past 400 hour(s))   URINALYSIS W/ REFLEX CULTURE    Collection Time: 09/21/21  9:45 PM    Specimen: Urine    Urine specimen   Result Value Ref Range    Color YELLOW/STRAW      Appearance HAZY (A) CLEAR      Specific gravity >1.030 (H) 1.003 - 1.030    pH (UA) 6.0 5.0 - 8.0      Protein 30 (A) NEG mg/dL    Glucose Negative NEG mg/dL    Ketone Negative NEG mg/dL    Blood LARGE (A) NEG      Urobilinogen 0.2 0.2 - 1.0 EU/dL    Nitrites Negative NEG      Leukocyte Esterase TRACE (A) NEG      WBC 5-10 0 - 4 /hpf    RBC >100 /hpf    Epithelial cells FEW FEW /lpf    Bacteria 1+ (A) NEG /hpf    UA:UC IF INDICATED CULTURE NOT INDICATED BY UA RESULT     METABOLIC PANEL, COMPREHENSIVE    Collection Time: 09/21/21  9:45 PM    Result Value Ref Range    Sodium 139 136 - 145 mmol/L    Potassium 3.8 3.5 - 5.1 mmol/L    Chloride 103 98 - 107 mmol/L    CO2 25 22 - 29 mmol/L    Anion gap 11 5 - 15 mmol/L    Glucose 84 65 - 100 mg/dL    BUN 8 6 - 20 MG/DL    Creatinine 0.58 0.50 - 0.90 MG/DL    BUN/Creatinine ratio 14 12 - 20      eGFR >60 >60 ml/min/1.73m    Calcium 8.6 8.6 - 10.0 MG/DL    Bilirubin, total 0.4 0.2 - 1.0 MG/DL    ALT (SGPT) 20 10 - 35 U/L    AST (SGOT) 24 10 - 35 U/L    Alk. phosphatase 74 35 - 104 U/L    Protein, total 7.2 6.4 - 8.3 g/dL    Albumin 3.9 3.5 - 5.2 g/dL    Globulin 3.3 2.0 - 4.0 g/dL    A-G Ratio 1.2 1.1 - 2.2     CBC WITH AUTOMATED DIFF    Collection Time: 09/21/21  9:45 PM   Result Value Ref Range    WBC 4.8 3.6 - 11.0 K/uL    RBC 4.74 3.80 - 5.20 M/uL    HGB 12.2 11.5 - 16.0 g/dL    HCT 37.6 35.0 - 47.0 %    MCV 79.3 (L) 80.0 - 99.0 FL    MCH 25.7 (L) 26.0 - 34.0 PG    MCHC 32.4 30.0 - 36.5 g/dL    RDW 13.2 11.5 - 14.5 %    PLATELET 302 150 - 400 K/uL    MPV 10.2 8.9 - 12.9 FL    NRBC 0.0 0 PER 100 WBC    ABSOLUTE NRBC 0.00 0.00 - 0.01 K/uL    NEUTROPHILS 38 32 - 75 %    LYMPHOCYTES 45 12 - 49 %  MONOCYTES 12 5 - 13 %    EOSINOPHILS 4 (L) 7 %    BASOPHILS 1 0 - 1 %    IMMATURE GRANULOCYTES 0 0 - 0.5 %    ABS. NEUTROPHILS 1.8 1.8 - 8.0 K/UL    ABS. LYMPHOCYTES 2.2 0.8 - 3.5 K/UL    ABS. MONOCYTES 0.6 0.0 - 1.0 K/UL    ABS. EOSINOPHILS 0.2 0.0 - 0.4 K/UL    ABS. BASOPHILS 0.0 0 - 1 K/UL    ABS. IMM. GRANS. 0.0 0.00 - 0.04 K/UL    DF AUTOMATED     BILIRUBIN, CONFIRM    Collection Time: 09/21/21  9:45 PM   Result Value Ref Range    Bilirubin UA, confirm Positive (A) NEG     HCG URINE, QL. - POC    Collection Time: 09/21/21 10:59 PM   Result Value Ref Range    Pregnancy test,urine (POC) Negative NEG     OCCULT BLOOD, STOOL    Collection Time: 09/22/21 12:17 AM   Result Value Ref Range    Occult blood, stool Negative NEG         Radiologic Studies -   No orders to display     CT Results  (Last 48 hours)       None          CXR Results  (Last 48 hours)      None            Medical Decision Making   I, Nena Polio, MD-- am the first provider for this patient, and I am the attending of record for this patient encounter.    I reviewed the vital signs, available nursing notes, past medical history, past surgical history, family history and social history.    Vital Signs-Reviewed the patient's vital signs.  Patient Vitals for the past 24 hrs:   Temp Pulse Resp BP SpO2   09/21/21 2158 -- -- -- 101/61 99 %   09/21/21 2132 97.8 ??F (36.6 ??C) 91 16 116/75 99 %     Records Reviewed: Prior medical records and Nursing notes    Provider Notes (Medical Decision Making):   DDx: Viral gastroenteritis, food poisoning, dehydration, electrolyte abnormality, upper GI bleed, blood loss anemia, gastric sleeve complication, etc.    Plan: CBC, CMP, UA, urine pregnancy.    Will perform digital rectal exam/and send sample for fecal occult analysis.  If labs demonstrate acute anemia concerning for significant blood loss, and/or if fecal occult positive/gross melena visualized on DRE, may need to consider CTA/bleeding scan.  We will hold off on abdominal imaging for now as patient without reproducible abdominal pain on exam, and her history not fully convincing for a true/definitive upper GI bleed, and personally I have lower clinical suspicion for this based on history, risk factors and presentation. Though certainly patient's history of gastric sleeve surgery puts her at somewhat increased risk.    We will treat patient with Zofran, IVF.    Labs without evidence of anemia-- hemoglobin of 12.2, hematocrit of 37.6.  Digital rectal exam negative for gross melena--stool is dark brown in color.  Fecal occult is also negative.  Results discussed with the patient.  Low suspicion for upper GI bleeding.  Risk of radiation outweigh any benefit as doubt significant potential meaningful findings, therefore no indication for imaging.  Patient is in agreement  with this.  She is feeling better at the time of my repeat evaluation.  No longer feeling nauseated.  Able to tolerate p.o. Suspect symptoms today secondary to uncomplicated gastroenteritis.  Will discharge at this time in stable improved condition with Rx for Zofran for ongoing symptomatic management.  Advised aggressive p.o. hydration at home for the duration of her illness.  Follow-up with PCP.  Return precautions discussed.         ED Course:   Initial assessment performed. The patient's presenting problems have been discussed, and they are in agreement with the care plan formulated and outlined with them.  I have encouraged them to ask questions as they arise throughout their visit.    Nena Polio, MD      Disposition:  DC      DISCHARGE PLAN:  1.   Discharge Medication List as of 09/22/2021  1:06 AM        2.   Follow-up Information       Follow up With Specialties Details Why Contact Info    Your PCP  In 2 days            3.  Return to ED if worse     Diagnosis     Clinical Impression:   1. Gastroenteritis, acute    2. H/O gastric sleeve        Attestations:    Nena Polio, MD    Please note that this dictation was completed with Dragon, the computer voice recognition software.  Quite often unanticipated grammatical, syntax, homophones, and other interpretive errors are inadvertently transcribed by the computer software.  Please disregard these errors.  Please excuse any errors that have escaped final proofreading.  Thank you.

## 2021-09-21 NOTE — ED Notes (Signed)
 Pt ambulatory to ER c/o N/V/D onset Friday. Pt denies any abd pain. Pt sts diarrhea now black and watery. Gastric sleeve 39yrs ago.

## 2021-09-22 ENCOUNTER — Inpatient Hospital Stay
Admit: 2021-09-22 | Discharge: 2021-09-22 | Disposition: A | Discharge status: Home / Self Care | Payer: BLUE CROSS/BLUE SHIELD | Attending: Emergency Medicine

## 2021-09-22 LAB — CBC WITH AUTOMATED DIFF
ABS. BASOPHILS: 0 10*3/uL (ref 0–1)
ABS. EOSINOPHILS: 0.2 10*3/uL (ref 0.0–0.4)
ABS. IMM. GRANS.: 0 10*3/uL (ref 0.00–0.04)
ABS. LYMPHOCYTES: 2.2 10*3/uL (ref 0.8–3.5)
ABS. MONOCYTES: 0.6 10*3/uL (ref 0.0–1.0)
ABS. NEUTROPHILS: 1.8 10*3/uL (ref 1.8–8.0)
ABSOLUTE NRBC: 0 10*3/uL (ref 0.00–0.01)
BASOPHILS: 1 % (ref 0–1)
EOSINOPHILS: 4 % — ABNORMAL LOW
HCT: 37.6 % (ref 35.0–47.0)
HGB: 12.2 g/dL (ref 11.5–16.0)
IMMATURE GRANULOCYTES: 0 % (ref 0–0.5)
LYMPHOCYTES: 45 % (ref 12–49)
MCH: 25.7 PG — ABNORMAL LOW (ref 26.0–34.0)
MCHC: 32.4 g/dL (ref 30.0–36.5)
MCV: 79.3 FL — ABNORMAL LOW (ref 80.0–99.0)
MONOCYTES: 12 % (ref 5–13)
MPV: 10.2 FL (ref 8.9–12.9)
NEUTROPHILS: 38 % (ref 32–75)
NRBC: 0 PER 100 WBC
PLATELET: 302 10*3/uL (ref 150–400)
RBC: 4.74 M/uL (ref 3.80–5.20)
RDW: 13.2 % (ref 11.5–14.5)
WBC: 4.8 10*3/uL (ref 3.6–11.0)

## 2021-09-22 LAB — URINALYSIS W/ REFLEX CULTURE
Glucose, Ur: NEGATIVE mg/dL
Glucose: NEGATIVE mg/dL
Ketone: NEGATIVE mg/dL
Ketones, Urine: NEGATIVE mg/dL
Nitrite, Urine: NEGATIVE
Nitrites: NEGATIVE
Protein, UA: 30 mg/dL — AB
Protein: 30 mg/dL — AB
RBC, UA: >100 /hpf
RBC: >100 /hpf
Specific Gravity, UA: >1.03 NA — ABNORMAL HIGH (ref 1.003–1.030)
Specific gravity: >1.03 — ABNORMAL HIGH (ref 1.003–1.030)
Urobilinogen, UA, POCT: 0.2 EU/dL (ref 0.2–1.0)
Urobilinogen: 0.2 EU/dL (ref 0.2–1.0)
pH (UA): 6 (ref 5.0–8.0)
pH, UA: 6 (ref 5.0–8.0)

## 2021-09-22 LAB — METABOLIC PANEL, COMPREHENSIVE
A-G Ratio: 1.2 (ref 1.1–2.2)
ALT (SGPT): 20 U/L (ref 10–35)
AST (SGOT): 24 U/L (ref 10–35)
Albumin: 3.9 g/dL (ref 3.5–5.2)
Alk. phosphatase: 74 U/L (ref 35–104)
Anion gap: 11 mmol/L (ref 5–15)
BUN/Creatinine ratio: 14 (ref 12–20)
BUN: 8 MG/DL (ref 6–20)
Bilirubin, total: 0.4 MG/DL (ref 0.2–1.0)
CO2: 25 mmol/L (ref 22–29)
Calcium: 8.6 MG/DL (ref 8.6–10.0)
Chloride: 103 mmol/L (ref 98–107)
Creatinine: 0.58 MG/DL (ref 0.50–0.90)
Globulin: 3.3 g/dL (ref 2.0–4.0)
Glucose: 84 mg/dL (ref 65–100)
Potassium: 3.8 mmol/L (ref 3.5–5.1)
Protein, total: 7.2 g/dL (ref 6.4–8.3)
Sodium: 139 mmol/L (ref 136–145)
eGFR: >60 mL/min/{1.73_m2} (ref >60)

## 2021-09-22 LAB — BILIRUBIN, CONFIRM: Bilirubin UA, confirm: POSITIVE — AB

## 2021-09-22 LAB — HCG URINE, QL. - POC
HCG, Pregnancy, Urine, POC: NEGATIVE
Pregnancy test,urine (POC): NEGATIVE

## 2021-09-22 LAB — OCCULT BLOOD, STOOL: Occult blood, stool: NEGATIVE

## 2021-09-22 LAB — CBC WITH AUTO DIFFERENTIAL
Basophils %: 1 % (ref 0–1)
Basophils Absolute: 0 10*3/uL (ref 0–1)
Eosinophils %: 4 % — ABNORMAL LOW
Eosinophils Absolute: 0.2 10*3/uL (ref 0.0–0.4)
Granulocyte Absolute Count: 0 10*3/uL (ref 0.00–0.04)
Hematocrit: 37.6 % (ref 35.0–47.0)
Hemoglobin: 12.2 g/dL (ref 11.5–16.0)
Immature Granulocytes: 0 % (ref 0–0.5)
Lymphocytes %: 45 % (ref 12–49)
Lymphocytes Absolute: 2.2 10*3/uL (ref 0.8–3.5)
MCH: 25.7 PG — ABNORMAL LOW (ref 26.0–34.0)
MCHC: 32.4 g/dL (ref 30.0–36.5)
MCV: 79.3 FL — ABNORMAL LOW (ref 80.0–99.0)
MPV: 10.2 FL (ref 8.9–12.9)
Monocytes %: 12 % (ref 5–13)
Monocytes Absolute: 0.6 10*3/uL (ref 0.0–1.0)
NRBC Absolute: 0 10*3/uL (ref 0.00–0.01)
Neutrophils %: 38 % (ref 32–75)
Neutrophils Absolute: 1.8 10*3/uL (ref 1.8–8.0)
Nucleated RBCs: 0 PER 100 WBC
Platelets: 302 10*3/uL (ref 150–400)
RBC: 4.74 M/uL (ref 3.80–5.20)
RDW: 13.2 % (ref 11.5–14.5)
WBC: 4.8 10*3/uL (ref 3.6–11.0)

## 2021-09-22 LAB — COMPREHENSIVE METABOLIC PANEL
ALT: 20 U/L (ref 10–35)
AST: 24 U/L (ref 10–35)
Albumin/Globulin Ratio: 1.2 (ref 1.1–2.2)
Albumin: 3.9 g/dL (ref 3.5–5.2)
Alkaline Phosphatase: 74 U/L (ref 35–104)
Anion Gap: 11 mmol/L (ref 5–15)
BUN: 8 MG/DL (ref 6–20)
Bun/Cre Ratio: 14 (ref 12–20)
CO2: 25 mmol/L (ref 22–29)
Calcium: 8.6 MG/DL (ref 8.6–10.0)
Chloride: 103 mmol/L (ref 98–107)
Creatinine: 0.58 MG/DL (ref 0.50–0.90)
ESTIMATED GLOMERULAR FILTRATION RATE: >60 mL/min/{1.73_m2} (ref >60)
Globulin: 3.3 g/dL (ref 2.0–4.0)
Glucose: 84 mg/dL (ref 65–100)
Potassium: 3.8 mmol/L (ref 3.5–5.1)
Sodium: 139 mmol/L (ref 136–145)
Total Bilirubin: 0.4 MG/DL (ref 0.2–1.0)
Total Protein: 7.2 g/dL (ref 6.4–8.3)

## 2021-09-22 LAB — BILIRUBIN, CONFIRMATORY: Bilirubin Urine: POSITIVE — AB

## 2021-09-22 LAB — *Unknown: Occult Blood Fecal: NEGATIVE

## 2021-09-22 MED ORDER — ONDANSETRON (PF) 4 MG/2 ML INJECTION
4 mg/2 mL | INTRAMUSCULAR | Status: AC
Start: 2021-09-22 — End: 2021-09-21
  Administered 2021-09-22: 04:00:00 via INTRAVENOUS

## 2021-09-22 MED ORDER — ACETAMINOPHEN 500 MG TAB
500 mg | Freq: Once | ORAL | Status: AC
Start: 2021-09-22 — End: 2021-09-21
  Administered 2021-09-22: 04:00:00 via ORAL

## 2021-09-22 MED ORDER — ONDANSETRON HCL 4 MG TAB
4 mg | ORAL_TABLET | Freq: Three times a day (TID) | ORAL | 0 refills | Status: AC | PRN
Start: 2021-09-22 — End: ?

## 2021-09-22 MED ORDER — SODIUM CHLORIDE 0.9% BOLUS IV
0.9 % | Freq: Once | INTRAVENOUS | Status: AC
Start: 2021-09-22 — End: 2021-09-21
  Administered 2021-09-22: 03:00:00 via INTRAVENOUS

## 2021-09-22 MED FILL — ACETAMINOPHEN 500 MG TAB: 500 mg | ORAL | Qty: 2

## 2021-09-22 MED FILL — ONDANSETRON (PF) 4 MG/2 ML INJECTION: 4 mg/2 mL | INTRAMUSCULAR | Qty: 2

## 2021-09-22 MED FILL — SODIUM CHLORIDE 0.9 % IV: INTRAVENOUS | Qty: 1000

## 2021-09-22 NOTE — ED Notes (Signed)
 I have reviewed discharge instructions with the patient.  Opportunity for questions and clarification was provided.  The patient verbalized understanding.  Patient discharged out of the ED via ambulation with no difficulty and in stable condition.

## 2022-03-10 NOTE — Progress Notes (Signed)
Patient attended our VIRTUAL Medically Supervised Weight Loss New Patient Orientation on Wednesday March 04, 2022 where we discussed:  New Direction Very Low and the Low Calorie diet details  Medical Supervision  Nutrition education  Cost of Meal Replacements  Patient has been emailed the new patient paperwork to complete along with the copy of the power point presentation. Once patient returns the paperwork to our office, we will contact them to schedule a consultation.

## 2022-03-18 ENCOUNTER — Encounter: Payer: BLUE CROSS/BLUE SHIELD | Attending: Internal Medicine

## 2022-12-30 ENCOUNTER — Inpatient Hospital Stay
Admit: 2022-12-30 | Discharge: 2022-12-30 | Disposition: A | Discharge status: Home / Self Care | Payer: BLUE CROSS/BLUE SHIELD | Attending: Emergency Medicine

## 2022-12-30 ENCOUNTER — Emergency Department: Admit: 2022-12-30 | Payer: BLUE CROSS/BLUE SHIELD

## 2022-12-30 DIAGNOSIS — R0789 Other chest pain: Secondary | ICD-10-CM

## 2022-12-30 LAB — CBC WITH AUTO DIFFERENTIAL
Basophils %: 1 % (ref 0–1)
Basophils Absolute: 0.1 10*3/uL (ref 0.0–0.1)
Eosinophils %: 3 % (ref 0–7)
Eosinophils Absolute: 0.3 10*3/uL (ref 0.0–0.4)
Hematocrit: 35.8 % (ref 35.0–47.0)
Hemoglobin: 11.2 g/dL — ABNORMAL LOW (ref 11.5–16.0)
Immature Granulocytes %: 0 %
Immature Granulocytes Absolute: 0 10*3/uL
Lymphocytes %: 43 % (ref 12–49)
Lymphocytes Absolute: 4.4 10*3/uL — ABNORMAL HIGH (ref 0.8–3.5)
MCH: 23.3 PG — ABNORMAL LOW (ref 26.0–34.0)
MCHC: 31.3 g/dL (ref 30.0–36.5)
MCV: 74.4 FL — ABNORMAL LOW (ref 80.0–99.0)
MPV: 9.9 FL (ref 8.9–12.9)
Monocytes %: 7 % (ref 5–13)
Monocytes Absolute: 0.7 10*3/uL (ref 0.0–1.0)
Neutrophils %: 46 % (ref 32–75)
Neutrophils Absolute: 4.7 10*3/uL (ref 1.8–8.0)
Nucleated RBCs: 0 PER 100 WBC
Platelets: 361 10*3/uL (ref 150–400)
RBC: 4.81 M/uL (ref 3.80–5.20)
RDW: 14.6 % — ABNORMAL HIGH (ref 11.5–14.5)
WBC: 10.2 10*3/uL (ref 3.6–11.0)
nRBC: 0 10*3/uL (ref 0.00–0.01)

## 2022-12-30 LAB — COMPREHENSIVE METABOLIC PANEL
ALT: 23 U/L (ref 10–35)
AST: 19 U/L (ref 10–35)
Albumin/Globulin Ratio: 1.2 (ref 1.1–2.2)
Albumin: 3.9 g/dL (ref 3.5–5.2)
Alk Phosphatase: 99 U/L (ref 35–104)
Anion Gap: 10 mmol/L (ref 5–15)
BUN/Creatinine Ratio: 20 (ref 12–20)
BUN: 14 MG/DL (ref 6–20)
CO2: 25 mmol/L (ref 22–29)
Calcium: 8.7 MG/DL (ref 8.6–10.0)
Chloride: 107 mmol/L (ref 98–107)
Creatinine: 0.71 MG/DL (ref 0.50–0.90)
Est, Glom Filt Rate: >90 mL/min/{1.73_m2} (ref >60)
Globulin: 3.3 g/dL (ref 2.0–4.0)
Glucose: 108 mg/dL — ABNORMAL HIGH (ref 65–100)
Potassium: 3.8 mmol/L (ref 3.5–5.1)
Sodium: 142 mmol/L (ref 136–145)
Total Bilirubin: 0.2 MG/DL (ref 0.2–1.0)
Total Protein: 7.2 g/dL (ref 6.4–8.3)

## 2022-12-30 LAB — EXTRA TUBES HOLD

## 2022-12-30 LAB — POC PREGNANCY UR-QUAL: Preg Test, Ur: NEGATIVE

## 2022-12-30 LAB — D-DIMER, RAPID: D-Dimer, Quant: 0.39 ug/ml(FEU) (ref <0.50)

## 2022-12-30 LAB — TROPONIN T: Troponin T: <6 ng/L (ref 0–14)

## 2022-12-30 NOTE — ED Triage Notes (Signed)
PT ambulatory to ED with complaints of covid 2 weeks ago and still having SOB. PT also having L leg discomfort and numbness.

## 2022-12-30 NOTE — ED Notes (Signed)
Pt given discharge instructions, patient education, 0 prescriptions and follow up information. Pt verbalizes understanding. All questions answered. Pt discharged to home in private vehicle, ambulatory. Pt A&Ox4, RA, pain controlled.

## 2022-12-30 NOTE — ED Provider Notes (Signed)
[facility] EMERGENCY DEPT  EMERGENCY DEPARTMENT ENCOUNTER      Pt Name: Cristina Rios  MRN: [id number]  Birthdate 07-22-1996  Date of evaluation: 12/30/2022  Provider: Alwyn Ren, PA    CHIEF COMPLAINT       Chief Complaint   Patient presents with    Leg Pain    Shortness of Breath         HISTORY OF PRESENT ILLNESS   (Location/Symptom, Timing/Onset, Context/Setting, Quality, Duration, Modifying Factors, Severity)  Note limiting factors.   Cristina Rios is a 27 y.o. female with past medical history as listed below who presents to the emergency department for evaluation of chest pain, shortness of breath with onset 2 to 3 days ago.  She states she recently recovered from a COVID infection 2 weeks ago, but that the shortness of breath she was experiencing while she was sick had completely resolved.  Last night she also noticed some pain shooting down her left leg, has not noticed any leg swelling.  She denies any DVT/PE or cardiac history.  Uses Nexplanon for birth control.      Nursing Notes were reviewed.    REVIEW OF SYSTEMS    (2-9 systems for level 4, 10 or more for level 5)     Review of Systems   Constitutional:  Negative for fever.   HENT:  Negative for congestion and sore throat.    Eyes:  Negative for visual disturbance.   Respiratory:  Positive for shortness of breath. Negative for cough.    Cardiovascular:  Positive for chest pain.   Gastrointestinal:  Negative for abdominal pain, diarrhea and vomiting.   Genitourinary:  Negative for dysuria.   Musculoskeletal:  Positive for myalgias. Negative for back pain and neck pain.   Skin:  Negative for color change.   Neurological:  Negative for dizziness and headaches.   Psychiatric/Behavioral:  Negative for confusion.        Except as noted above the remainder of the review of systems was reviewed and negative.       PAST MEDICAL HISTORY   History reviewed. No pertinent past medical history.    SURGICAL HISTORY       Past Surgical History:   Procedure  Laterality Date    APPENDECTOMY N/A     CHOLECYSTECTOMY N/A        CURRENT MEDICATIONS       Previous Medications    ONDANSETRON (ZOFRAN) 4 MG TABLET    Take 1 tablet by mouth every 8 hours as needed    VENLAFAXINE (EFFEXOR) 75 MG TABLET    Take 1 tablet by mouth       ALLERGIES     Patient has no known allergies.    FAMILY HISTORY     History reviewed. No pertinent family history.     SOCIAL HISTORY       Social History     Socioeconomic History    Marital status: Single     Spouse name: None    Number of children: None    Years of education: None    Highest education level: None   Tobacco Use    Smoking status: Never   Vaping Use    Vaping Use: Never used   Substance and Sexual Activity    Alcohol use: Never    Drug use: Never         PHYSICAL EXAM    (up to 7 for level 4, 8 or more for level 5)  Vitals:   Vitals:    12/30/22 1717   BP: 121/81   Pulse: 91   Resp: 18   Temp: 98.5 F (36.9 C)   TempSrc: Oral   SpO2: 98%   Weight: 127 kg (280 lb)   Height: 1.727 m (5\' 8" )       Physical Exam  Vitals and nursing note reviewed.   Constitutional:       General: She is not in acute distress.     Appearance: Normal appearance. She is not toxic-appearing or diaphoretic.   HENT:      Head: Normocephalic and atraumatic.   Eyes:      Extraocular Movements: Extraocular movements intact.      Conjunctiva/sclera: Conjunctivae normal.   Cardiovascular:      Rate and Rhythm: Normal rate and regular rhythm.      Heart sounds: Normal heart sounds.   Pulmonary:      Effort: Pulmonary effort is normal.      Breath sounds: Normal breath sounds.   Abdominal:      General: There is no distension.      Palpations: Abdomen is soft.      Tenderness: There is no abdominal tenderness.   Musculoskeletal:         General: Normal range of motion.      Cervical back: Normal range of motion.   Skin:     General: Skin is warm and dry.   Neurological:      General: No focal deficit present.      Mental Status: She is alert.             EMERGENCY  DEPARTMENT COURSE and DIFFERENTIAL DIAGNOSIS/MDM:       Medical Decision Making  Exam without evidence of volume overload so doubt heart failure. EKG without signs of active ischemia. Given the timing of pain to ER presentation, single troponin was normal so doubt NSTEMI. Presentation not consistent with acute PE (PERC negative, dimer negative),pneumothorax (not visualized on chest xr), thoracic aortic dissection, pericarditis, tamponade, pneumonia (no infectious symptoms, clear chest xr), myocarditis (no recent illness, neg trop). HEART score low risk.  Suspect may be deconditioning secondary to recent COVID infection.  Recommend PCP follow-up.  Impressions 1.      Amount and/or Complexity of Data Reviewed  Labs: ordered.  Radiology: ordered.  ECG/medicine tests: ordered.        Procedures    ED Course as of 12/30/22 1853   Wed Dec 30, 2022   1804 EKG 1801  ) Evaluation shows normal sinus rhythm at 84 bpm.  Normal axis.  Normal intervals.  No STEMI [GG]      ED Course User Index  [GG] Sherril Croon, DO   LABORATORY TESTS:  Recent Results (from the past 12 hour(s))   CBC with Auto Differential    Collection Time: 12/30/22  5:54 PM   Result Value Ref Range    WBC 10.2 3.6 - 11.0 K/uL    RBC 4.81 3.80 - 5.20 M/uL    Hemoglobin 11.2 (L) 11.5 - 16.0 g/dL    Hematocrit 16.1 09.6 - 47.0 %    MCV 74.4 (L) 80.0 - 99.0 FL    MCH 23.3 (L) 26.0 - 34.0 PG    MCHC 31.3 30.0 - 36.5 g/dL    RDW 04.5 (H) 40.9 - 14.5 %    Platelets 361 150 - 400 K/uL    MPV 9.9 8.9 - 12.9 FL    Nucleated  RBCs 0.0 0 PER 100 WBC    nRBC 0.00 0.00 - 0.01 K/uL    Neutrophils % PENDING %    Lymphocytes % PENDING %    Monocytes % PENDING %    Eosinophils % PENDING %    Basophils % PENDING %    Immature Granulocytes % PENDING %    Neutrophils Absolute PENDING K/UL    Lymphocytes Absolute PENDING K/UL    Monocytes Absolute PENDING K/UL    Eosinophils Absolute PENDING K/UL    Basophils Absolute PENDING K/UL    Immature Granulocytes Absolute PENDING K/UL     Differential Type PENDING    CMP    Collection Time: 12/30/22  5:54 PM   Result Value Ref Range    Sodium 142 136 - 145 mmol/L    Potassium 3.8 3.5 - 5.1 mmol/L    Chloride 107 98 - 107 mmol/L    CO2 25 22 - 29 mmol/L    Anion Gap 10 5 - 15 mmol/L    Glucose 108 (H) 65 - 100 mg/dL    BUN 14 6 - 20 MG/DL    Creatinine 1.61 0.96 - 0.90 MG/DL    BUN/Creatinine Ratio 20 12 - 20      Est, Glom Filt Rate >90 >60 ml/min/1.51m2    Calcium 8.7 8.6 - 10.0 MG/DL    Total Bilirubin 0.2 0.2 - 1.0 MG/DL    ALT 23 10 - 35 U/L    AST 19 10 - 35 U/L    Alk Phosphatase 99 35 - 104 U/L    Total Protein 7.2 6.4 - 8.3 g/dL    Albumin 3.9 3.5 - 5.2 g/dL    Globulin 3.3 2.0 - 4.0 g/dL    Albumin/Globulin Ratio 1.2 1.1 - 2.2     Troponin T [facility]    Collection Time: 12/30/22  5:54 PM   Result Value Ref Range    Troponin T <6.0 0 - 14 ng/L   D-Dimer, Rapid    Collection Time: 12/30/22  5:54 PM   Result Value Ref Range    D-Dimer, Quant 0.39 <0.50 ug/ml(FEU)   Extra Tubes Hold    Collection Time: 12/30/22  5:54 PM   Result Value Ref Range    Specimen HOld 1UR 1UCHOLD     Comment:        Add-on orders for these samples will be processed based on acceptable specimen integrity and analyte stability, which may vary by analyte.   POC Pregnancy Urine Qual    Collection Time: 12/30/22  5:55 PM   Result Value Ref Range    Preg Test, Ur Negative NEG     EKG 12 Lead    Collection Time: 12/30/22  6:01 PM   Result Value Ref Range    Ventricular Rate 84 BPM    Atrial Rate 84 BPM    P-R Interval 146 ms    QRS Duration 90 ms    Q-T Interval 380 ms    QTc Calculation (Bazett) 449 ms    P Axis 37 degrees    R Axis 39 degrees    T Axis 28 degrees    Diagnosis       Normal sinus rhythm  Normal ECG  No previous ECGs available         IMAGING RESULTS:   XR CHEST (2 VW)   Final Result   No acute process.  MEDICATIONS GIVEN:   Medications - No data to display    IMPRESSION:   1. Atypical chest pain        PLAN:   PATIENT REFERRED TO:   Your  Primary Care Provider    Schedule an appointment as soon as possible for a visit       DISCHARGE MEDICATIONS:  New Prescriptions    No medications on file     Return to the ED if worse    (Please note that portions of this note were completed with a voice recognition program.  Efforts were made to edit the dictations but occasionally words are mis-transcribed.)    Alwyn Ren, PA (electronically signed)       Alwyn Ren, PA  12/30/22 1857

## 2022-12-31 LAB — EKG 12-LEAD
Atrial Rate: 84 {beats}/min
Diagnosis: NORMAL
P Axis: 37 degrees
P-R Interval: 146 ms
Q-T Interval: 380 ms
QRS Duration: 90 ms
QTc Calculation (Bazett): 449 ms
R Axis: 39 degrees
T Axis: 28 degrees
Ventricular Rate: 84 {beats}/min

## 2023-01-19 ENCOUNTER — Ambulatory Visit
Admit: 2023-01-19 | Discharge: 2023-01-19 | Payer: BLUE CROSS/BLUE SHIELD | Attending: Student in an Organized Health Care Education/Training Program | Primary: Family Medicine

## 2023-01-19 VITALS — BP 99/65 | Wt 302.0 lb

## 2023-01-19 DIAGNOSIS — N898 Other specified noninflammatory disorders of vagina: Secondary | ICD-10-CM

## 2023-01-19 MED ORDER — FLUCONAZOLE 150 MG PO TABS
150 MG | ORAL_TABLET | ORAL | 1 refills | Status: DC | PRN
Start: 2023-01-19 — End: 2023-05-19

## 2023-01-19 NOTE — Progress Notes (Signed)
OB/GYN Problem VIsit    HPI  Cristina Rios is a No obstetric history on file.,  27 y.o. female who presents for a problem visit.   Vaginal burning x1w. Some white discharge and itching. Denies abnormal bleeding. Has nexplanon in place, uses condoms consistently, denies any new partners or concern for STI exposure.   Stabbing R pelvic pain, resolved spontaneously within a few seconds x 3d. Denies F/C, N/V/D, urinary complaints.       History reviewed. No pertinent past medical history.  Past Surgical History:   Procedure Laterality Date    APPENDECTOMY N/A     CHOLECYSTECTOMY N/A     GASTRIC RESTRICTION SURGERY  02/20/2020    Gastric sleeve     Social History     Occupational History    Not on file   Tobacco Use    Smoking status: Never    Smokeless tobacco: Never   Vaping Use    Vaping Use: Never used   Substance and Sexual Activity    Alcohol use: Never    Drug use: Never    Sexual activity: Yes     Partners: Male     Birth control/protection: Implant     History reviewed. No pertinent family history.    Allergies   Allergen Reactions    Adhesive Tape Itching and Rash    Lamotrigine Swelling     Joints become swollen.     Prior to Admission medications    Medication Sig Start Date End Date Taking? Authorizing Provider   ondansetron (ZOFRAN) 4 MG tablet Take 1 tablet by mouth every 8 hours as needed  Patient not taking: Reported on 01/19/2023 09/22/21   Automatic Reconciliation, Ar   venlafaxine (EFFEXOR) 75 MG tablet Take 1 tablet by mouth  Patient not taking: Reported on 01/19/2023    Automatic Reconciliation, Ar        Review of Systems: History obtained from the patient  Constitutional: negative for weight loss, fever, night sweats  Breast: negative for breast lumps, nipple discharge, galactorrhea  GI: negative for change in bowel habits, abdominal pain, black or bloody stools  GU: negative for frequency, dysuria, hematuria, vaginal discharge  MSK: negative for back pain, joint pain, muscle pain  Skin: negative  for itching, rash, hives  Psych: negative for anxiety, depression, change in mood      Objective:  BP 99/65   Wt (!) 137 kg (302 lb)   LMP 12/21/2022 (Exact Date) Comment: irregular w/clamp  BMI 45.92 kg/m     Physical Exam:   PHYSICAL EXAMINATION    Constitutional  Appearance: well-nourished, well developed, alert, in no acute distress      Gastrointestinal  Abdominal Examination: abdomen non-tender to palpation, normal bowel sounds, no masses present  Liver and spleen: no hepatomegaly present, spleen not palpable  Hernias: no hernias identified    Genitourinary  External Genitalia: normal appearance for age, no discharge present, no tenderness present, no inflammatory lesions present, no masses present, no atrophy present  Vagina: white discharge coating vaginal walls, normal vaginal vault without central or paravaginal defects, no inflammatory lesions present, no masses present  Bladder: non-tender to palpation  Urethra: appears normal  Cervix: normal   Uterus: normal size, shape and consistency  Adnexa: no adnexal tenderness present, no adnexal masses present  Perineum: perineum within normal limits, no evidence of trauma, no rashes or skin lesions present  Anus: anus within normal limits, no hemorrhoids present  Inguinal Lymph Nodes: no lymphadenopathy present  Skin  General Inspection: no rash, no lesions identified    Neurologic/Psychiatric  Mental Status:  Orientation: grossly oriented to person, place and time  Mood and Affect: mood normal, affect appropriate      ASSESSMENT:    ICD-10-CM    1. Vaginal itching  N89.8 Nuswab Vaginitis Plus (VG+)      2. Pelvic pain  R10.2 Nuswab Vaginitis Plus (VG+)          PLAN:  Orders Placed This Encounter    Nuswab Vaginitis Plus (VG+)     27yo with exam and hx c/w candidiasis. Nuswab collected and rx'd diflucan with refill.  Also some RLQ pain. Benign exam. Discussed observation and if persists would recommend [place].  RTO prn if symptoms persist or  worsen.  Instructions given to pt.  Handouts given to pt.    Melina Fiddler, MD  [facility]

## 2023-01-19 NOTE — Progress Notes (Signed)
Cristina Rios is a 27 y.o. female presents for a problem visit.    Chief Complaint   Patient presents with    vaginal burning     Patient's last menstrual period was 12/21/2022 (exact date).  Birth Control: Nexplanon.  Last Pap: abnormal obtained 1 year(s) ago pet pt. Recommended one year reapet    The patient is reporting having:  pelvic pain  for 1 weeks.      Examination chaperoned by Sonnie Alamo, MA.

## 2023-01-22 LAB — NUSWAB VAGINITIS PLUS (VG+)
Atopobium Vaginae: HIGH Score — AB
Bacterial Vaginosis Associated Bacterium 2 DNA: HIGH Score — AB
Candida albicans, NAA: NEGATIVE
Candida glabrata: NEGATIVE
Chlamydia trachomatis, NAA: NEGATIVE
Megasphaera DNA, Vag: HIGH Score — AB
Neisseria Gonorrhoeae, NAA: NEGATIVE
Trichomonas Vaginalis by NAA: NEGATIVE

## 2023-01-22 MED ORDER — METRONIDAZOLE 500 MG PO TABS
500 MG | ORAL_TABLET | Freq: Two times a day (BID) | ORAL | 0 refills | Status: AC
Start: 2023-01-22 — End: 2023-01-29

## 2023-01-22 NOTE — Addendum Note (Signed)
Addended by: Melina Fiddler on: 01/22/2023 08:24 AM     Modules accepted: Orders

## 2023-03-15 ENCOUNTER — Ambulatory Visit
Admit: 2023-03-15 | Discharge: 2023-03-15 | Payer: BLUE CROSS/BLUE SHIELD | Attending: Student in an Organized Health Care Education/Training Program

## 2023-03-15 DIAGNOSIS — N926 Irregular menstruation, unspecified: Secondary | ICD-10-CM

## 2023-03-15 LAB — AMB POC URINE PREGNANCY TEST, VISUAL COLOR COMPARISON: HCG, Pregnancy, Urine, POC: NEGATIVE

## 2023-03-15 NOTE — Progress Notes (Signed)
Cristina Rios is a 27 y.o. female presents for a problem visit.    Chief Complaint   Patient presents with    missing menses     Patient's last menstrual period was 01/18/2023 (exact date).  Birth Control: implant.  Last Pap: abnormal obtained 1 year(s) ago per PT.    The patient is reporting having: usually regular menses, but missing since 01/18/2023  Examination chaperoned by Sonnie Alamo, MA.

## 2023-03-15 NOTE — Progress Notes (Signed)
OB/GYN Problem VIsit    HPI  Cristina Rios is a G0P0000,  27 y.o. female who presents for a problem visit.     Here to discuss missing period. Nexplanon in place x 1 year. This is her 3rd device. Her periods typically come monthly, and it's now been 2 months since her last cycle. No discharge.     Past Medical History:   Diagnosis Date    Anemia 11/02/22    Depression 03/04/2015    Migraine 02/25/23     Past Surgical History:   Procedure Laterality Date    APPENDECTOMY N/A     CHOLECYSTECTOMY N/A     GASTRIC RESTRICTION SURGERY  02/20/2020    Gastric sleeve     Social History     Occupational History    Not on file   Tobacco Use    Smoking status: Never    Smokeless tobacco: Never   Vaping Use    Vaping status: Never Used   Substance and Sexual Activity    Alcohol use: Never    Drug use: Never    Sexual activity: Yes     Partners: Male     Birth control/protection: Implant     Family History   Problem Relation Age of Onset    Migraines Mother     Diabetes Maternal Grandfather     Stroke Maternal Grandfather     Bleeding Prob Maternal Grandmother     Diabetes Maternal Grandmother     Heart Surgery Maternal Grandmother     Diabetes Paternal Grandfather     Heart Surgery Paternal Grandfather     Stroke Paternal Grandfather     Heart Surgery Paternal Grandmother     Ovarian Cancer Paternal Grandmother        Allergies   Allergen Reactions    Adhesive Tape Itching and Rash    Lamotrigine Swelling     Joints become swollen.     Prior to Admission medications    Medication Sig Start Date End Date Taking? Authorizing Provider   fluconazole (DIFLUCAN) 150 MG tablet Take 1 tablet by mouth every 72 hours as needed (vaginal burning) 01/19/23   Nataniel Gasper, Vernona Rieger, MD   ondansetron (ZOFRAN) 4 MG tablet Take 1 tablet by mouth every 8 hours as needed  Patient not taking: Reported on 01/19/2023 09/22/21   Automatic Reconciliation, Ar   venlafaxine (EFFEXOR) 75 MG tablet Take 1 tablet by mouth  Patient not taking: Reported on 01/19/2023     Automatic Reconciliation, Ar        Review of Systems: History obtained from the patient  Constitutional: negative for weight loss, fever, night sweats  Breast: negative for breast lumps, nipple discharge, galactorrhea  GI: negative for change in bowel habits, abdominal pain, black or bloody stools  GU: negative for frequency, dysuria, hematuria, vaginal discharge  MSK: negative for back pain, joint pain, muscle pain  Skin: negative for itching, rash, hives  Psych: negative for anxiety, depression, change in mood      Objective:  BP 126/79   Pulse (!) 102   Ht 1.727 m (5\' 8" )   Wt 133.8 kg (295 lb)   LMP 01/18/2023 (Exact Date)   BMI 44.85 kg/m     Physical Exam:   PHYSICAL EXAMINATION    Constitutional  Appearance: well-nourished, well developed, alert, in no acute distress      Gastrointestinal  Abdominal Examination: abdomen non-tender to palpation, normal bowel sounds, no masses present  Liver and spleen: no  hepatomegaly present, spleen not palpable  Hernias: no hernias identified    Skin  General Inspection: no rash, no lesions identified    Neurologic/Psychiatric  Mental Status:  Orientation: grossly oriented to person, place and time  Mood and Affect: mood normal, affect appropriate      ASSESSMENT:    ICD-10-CM    1. Irregular bleeding  N92.6       2. Missed menses  N92.6 AMB POC URINE PREGNANCY TEST, VISUAL COLOR COMPARISON          PLAN:  Orders Placed This Encounter    AMB POC URINE PREGNANCY TEST, VISUAL COLOR COMPARISON     27yo with irregular bleeding with nexplanon in place.  UPT negative. Provided reassurance and counseled on the irregular bleeding patterns associated with the nexplanon device. Patient not interested in switching methods at this time.  Reports pelvic pain previuosly described now resolved. F/u in 6mos for annual exam.   RTO prn if symptoms persist or worsen.  Instructions given to pt.  Handouts given to pt.    Melina Fiddler, MD  [facility]

## 2023-03-29 ENCOUNTER — Inpatient Hospital Stay
Admit: 2023-03-29 | Discharge: 2023-03-30 | Disposition: A | Discharge status: Home / Self Care | Payer: BLUE CROSS/BLUE SHIELD | Attending: Student in an Organized Health Care Education/Training Program

## 2023-03-29 DIAGNOSIS — R112 Nausea with vomiting, unspecified: Secondary | ICD-10-CM

## 2023-03-29 DIAGNOSIS — R110 Nausea: Secondary | ICD-10-CM

## 2023-03-29 LAB — URINALYSIS WITH REFLEX TO CULTURE
Blood, Urine: NEGATIVE
Glucose, Ur: NEGATIVE mg/dL
Ketones, Urine: 15 mg/dL — AB
Leukocyte Esterase, Urine: NEGATIVE
Nitrite, Urine: NEGATIVE
Specific Gravity, UA: >1.03 — ABNORMAL HIGH (ref 1.003–1.030)
Urobilinogen, Urine: 0.2 EU/dL (ref 0.2–1.0)
pH, Urine: 6 (ref 5.0–8.0)

## 2023-03-29 LAB — COMPREHENSIVE METABOLIC PANEL
ALT: 16 U/L (ref 10–35)
AST: 16 U/L (ref 10–35)
Albumin/Globulin Ratio: 1.2 (ref 1.1–2.2)
Albumin: 3.7 g/dL (ref 3.5–5.2)
Alk Phosphatase: 76 U/L (ref 35–104)
Anion Gap: 9 mmol/L (ref 5–15)
BUN/Creatinine Ratio: 14 (ref 12–20)
BUN: 9 mg/dL (ref 6–20)
CO2: 27 mmol/L (ref 22–29)
Calcium: 8.7 mg/dL (ref 8.6–10.0)
Chloride: 104 mmol/L (ref 98–107)
Creatinine: 0.65 mg/dL (ref 0.50–0.90)
Est, Glom Filt Rate: >90 mL/min/{1.73_m2} (ref >60)
Globulin: 3 g/dL (ref 2.0–4.0)
Glucose: 100 mg/dL (ref 65–100)
Potassium: 4.5 mmol/L (ref 3.5–5.1)
Sodium: 140 mmol/L (ref 136–145)
Total Bilirubin: 0.2 mg/dL (ref 0.2–1.0)
Total Protein: 6.7 g/dL (ref 6.4–8.3)

## 2023-03-29 LAB — CBC WITH AUTO DIFFERENTIAL
Basophils %: 1 % (ref 0–1)
Basophils Absolute: 0.1 10*3/uL (ref 0–1)
Eosinophils %: 2 % — ABNORMAL LOW
Eosinophils Absolute: 0.2 10*3/uL (ref 0.0–0.4)
Hematocrit: 35.3 % (ref 35.0–47.0)
Hemoglobin: 10.9 g/dL — ABNORMAL LOW (ref 11.5–16.0)
Immature Granulocytes %: 0 % (ref 0–0.5)
Immature Granulocytes Absolute: 0 10*3/uL (ref 0.00–0.04)
Lymphocytes %: 27 % (ref 12–49)
Lymphocytes Absolute: 2.8 10*3/uL (ref 0.8–3.5)
MCH: 23.6 pg — ABNORMAL LOW (ref 26.0–34.0)
MCHC: 30.9 g/dL (ref 30.0–36.5)
MCV: 76.6 FL — ABNORMAL LOW (ref 80.0–99.0)
MPV: 10.5 FL (ref 8.9–12.9)
Monocytes %: 6 % (ref 5–13)
Monocytes Absolute: 0.6 10*3/uL (ref 0.0–1.0)
Neutrophils %: 64 % (ref 32–75)
Neutrophils Absolute: 6.7 10*3/uL (ref 1.8–8.0)
Nucleated RBCs: 0 /100{WBCs}
Platelets: 347 10*3/uL (ref 150–400)
RBC: 4.61 M/uL (ref 3.80–5.20)
RDW: 14.9 % — ABNORMAL HIGH (ref 11.5–14.5)
WBC: 10.4 10*3/uL (ref 3.6–11.0)
nRBC: 0 10*3/uL (ref 0.00–0.01)

## 2023-03-29 LAB — POC PREGNANCY UR-QUAL: Preg Test, Ur: NEGATIVE

## 2023-03-29 LAB — LIPASE: Lipase: 36 U/L (ref 13–60)

## 2023-03-29 LAB — BILIRUBIN, CONFIRMATORY: Bilirubin Confirmation, UA: NEGATIVE

## 2023-03-29 MED ORDER — DIPHENHYDRAMINE HCL 50 MG/ML IJ SOLN
50 | Freq: Once | INTRAMUSCULAR | Status: AC
Start: 2023-03-29 — End: 2023-03-29
  Administered 2023-03-29: 23:00:00 12.5 mg via INTRAVENOUS

## 2023-03-29 MED ORDER — PROCHLORPERAZINE EDISYLATE 10 MG/2ML IJ SOLN
10 | Freq: Once | INTRAMUSCULAR | Status: AC
Start: 2023-03-29 — End: 2023-03-29
  Administered 2023-03-29: 23:00:00 10 mg via INTRAVENOUS

## 2023-03-29 MED ORDER — LACTATED RINGERS IV BOLUS
Freq: Once | INTRAVENOUS | Status: AC
Start: 2023-03-29 — End: 2023-03-29
  Administered 2023-03-29: 22:00:00 1000 mL via INTRAVENOUS

## 2023-03-29 MED ORDER — KETOROLAC TROMETHAMINE 30 MG/ML IJ SOLN
30 | Freq: Once | INTRAMUSCULAR | Status: AC
Start: 2023-03-29 — End: 2023-03-29
  Administered 2023-03-29: 22:00:00 15 mg via INTRAVENOUS

## 2023-03-29 MED ORDER — ALUM & MAG HYDROXIDE-SIMETH 200-200-20 MG/5ML PO SUSP
200-200-20 MG/5ML | Freq: Once | ORAL | Status: AC
Start: 2023-03-29 — End: 2023-03-29
  Administered 2023-03-29: 23:00:00 40 mL via ORAL

## 2023-03-29 MED ORDER — DEXAMETHASONE SODIUM PHOSPHATE 4 MG/ML IJ SOLN
4 | Freq: Once | INTRAMUSCULAR | Status: AC
Start: 2023-03-29 — End: 2023-03-29
  Administered 2023-03-29: 23:00:00 4 mg via INTRAVENOUS

## 2023-03-29 MED FILL — MAG-AL PLUS 200-200-20 MG/5ML PO LIQD: 200-200-20 MG/5ML | ORAL | Qty: 30

## 2023-03-29 MED FILL — DEXAMETHASONE SODIUM PHOSPHATE 4 MG/ML IJ SOLN: 4 MG/ML | INTRAMUSCULAR | Qty: 1

## 2023-03-29 MED FILL — DIPHENHYDRAMINE HCL 50 MG/ML IJ SOLN: 50 MG/ML | INTRAMUSCULAR | Qty: 1

## 2023-03-29 MED FILL — KETOROLAC TROMETHAMINE 30 MG/ML IJ SOLN: 30 MG/ML | INTRAMUSCULAR | Qty: 1

## 2023-03-29 MED FILL — PROCHLORPERAZINE EDISYLATE 10 MG/2ML IJ SOLN: 10 MG/2ML | INTRAMUSCULAR | Qty: 2

## 2023-03-29 MED FILL — LACTATED RINGERS IV SOLN: INTRAVENOUS | Qty: 1000

## 2023-03-29 NOTE — ED Triage Notes (Signed)
Pt ambulatory in ED with c/o lower abdominal pain that comes and goes and nausea and headache x 1 month. Pt said she has implanted birth control and had regular periods until 2 months ago and she went to the OBGYN because she had all pregnancy symptoms and test was negative. Pt said she has continued with these symptoms since then.

## 2023-03-29 NOTE — ED Notes (Signed)
Patient was discharged at 2036 . Patient verbalized understanding of all discharge instructions. Patient alert and oriented, no acute distress when leaving ED. Patient ambulatory when leaving ED.

## 2023-03-29 NOTE — Discharge Instructions (Addendum)
You are seen today for evaluation of nausea and headaches.  You were improved on reevaluation after treatment with migraine cocktail and GI cocktail.  You have been given a prescription for omeprazole to take for nausea and vomiting related to eating, possible treatment for gastritis versus GERD versus esophagitis versus duodenitis.  Please take this medication as instructed first thing in the morning on empty stomach.  Please follow-up with your primary care doctor in the next week, you have also been provided with referral to gastroenterology for follow-up of your ongoing nausea and vomiting with eating.  You have also been given a prescription for Zofran to take as needed for nausea.  Please stay hydrated drinking plenty of electrolyte containing fluids.  Please return to the emergency department if you are having abdominal pain, persistent vomiting or any other new or concerning symptoms      Thank you for letting us take care of you, hope you feel better soon,  Brantley Fling MD

## 2023-03-29 NOTE — ED Provider Notes (Signed)
[facility] EMERGENCY DEPT  EMERGENCY DEPARTMENT ENCOUNTER      Pt Name: Cristina Rios  MRN: [id number]  Birthdate 04-21-1996  Date of evaluation: 03/29/2023  Provider: Lenon Ahmadi, MD    CHIEF COMPLAINT       Chief Complaint   Patient presents with    Nausea    Headache         HISTORY OF PRESENT ILLNESS   (Location/Symptom, Timing/Onset, Context/Setting, Quality, Duration, Modifying Factors, Severity)  Note limiting factors.   The history is provided by the patient.     27 year old female with history of depression, migraines, anemia presenting for nausea.  Patient reports that she has been having intermittent headaches related to not eating and drinking as much, has been losing weight as a result of ongoing nausea and vomiting.  Reports that her nausea and vomiting occurs related to eating, states that she does not have a gallbladder anymore so knows that this cannot be the problem.  Has not had any abdominal pain consistently, had abdominal cramps similar to period cramps last week but no abdominal pain today.  Reports she was seen by OB/GYN for her intermittent nausea which she thought was morning sickness but had a negative pregnancy test.  They told her that if she was having persistent symptoms to come to the emergency department.  Reports that her headaches are not severe, are usually responsive to Motrin, but have been consistent and associated with her nausea episodes.  Patient reports nonbloody nonbilious vomitus.  Reports normal bowel movements, denies diarrhea, reports no constipation.  Reports no urinary changes.  Reports that she has no history of GERD or gastritis that she is aware of.  Denies vaginal symptoms.    Review of External Medical Records:         Nursing Notes were reviewed.      REVIEW OF SYSTEMS    (2-9 systems for level 4, 10 or more for level 5)     Except as noted above the remainder of the review of systems was reviewed and negative.       PAST MEDICAL HISTORY     Past Medical  History:   Diagnosis Date    Anemia 11/02/22    Depression 03/04/2015    Migraine 02/25/23         SURGICAL HISTORY       Past Surgical History:   Procedure Laterality Date    APPENDECTOMY N/A     CHOLECYSTECTOMY N/A     GASTRIC RESTRICTION SURGERY  02/20/2020    Gastric sleeve         CURRENT MEDICATIONS       Discharge Medication List as of 03/29/2023  8:33 PM        CONTINUE these medications which have NOT CHANGED    Details   fluconazole (DIFLUCAN) 150 MG tablet Take 1 tablet by mouth every 72 hours as needed (vaginal burning), Disp-1 tablet, R-1Normal      venlafaxine (EFFEXOR) 75 MG tablet Take 1 tablet by mouthHistorical Med             ALLERGIES     Adhesive tape and Lamotrigine    FAMILY HISTORY       Family History   Problem Relation Age of Onset    Migraines Mother     Diabetes Maternal Grandfather     Stroke Maternal Grandfather     Bleeding Prob Maternal Grandmother     Diabetes Maternal Grandmother     Heart  Surgery Maternal Grandmother     Diabetes Paternal Grandfather     Heart Surgery Paternal Grandfather     Stroke Paternal Grandfather     Heart Surgery Paternal Grandmother     Ovarian Cancer Paternal Grandmother           SOCIAL HISTORY       Social History     Socioeconomic History    Marital status: Single     Spouse name: None    Number of children: None    Years of education: None    Highest education level: None   Tobacco Use    Smoking status: Never    Smokeless tobacco: Never   Vaping Use    Vaping status: Never Used   Substance and Sexual Activity    Alcohol use: Never    Drug use: Never    Sexual activity: Yes     Partners: Male     Birth control/protection: Implant     Social Determinants of Health     Financial Resource Strain: Low Risk  (03/31/2021)    Received from [facility], [facility]    Overall Financial Resource Strain (CARDIA)     Difficulty of Paying Living Expenses: Not hard at all   Food Insecurity: No Food Insecurity (03/31/2021)    Received from [facility], [facility]    Hunger Vital Sign     Worried About Running Out of Food in the Last Year: Never true     Ran Out of Food in the Last Year: Never true   Transportation Needs: No Transportation Needs (03/31/2021)    Received from [facility], [facility]    [facility] - Transportation     Lack of Transportation (Medical): No     Lack of Transportation (Non-Medical): No           PHYSICAL EXAM    (up to 7 for level 4, 8 or more for level 5)     ED Triage Vitals [03/29/23 1632]   BP Systolic BP Percentile Diastolic BP Percentile Temp Temp Source Pulse Respirations SpO2   139/81 -- -- 99.3 F (37.4 C) Oral 99 17 99 %      Height Weight - Scale         1.727 m (5\' 8" ) 131.5 kg (290 lb)             Body mass index is 44.09 kg/m.    Physical Exam  Vitals reviewed.   Constitutional:       General: She is not in acute distress.     Appearance: Normal appearance. She is not ill-appearing, toxic-appearing or diaphoretic.   HENT:      Head: Normocephalic and atraumatic.      Nose: Nose normal.   Eyes:      Conjunctiva/sclera: Conjunctivae normal.   Cardiovascular:      Rate and Rhythm: Normal rate.      Heart sounds: Normal heart sounds.   Pulmonary:      Effort: Pulmonary effort is normal. No respiratory distress.      Breath sounds: Normal breath sounds. No wheezing, rhonchi or rales.   Abdominal:      General: Abdomen is flat. There is no distension.      Palpations: Abdomen is soft.      Tenderness: There is no abdominal tenderness. There is no guarding or rebound.   Musculoskeletal:      Cervical back: Neck supple.  Right lower leg: No edema.      Left lower leg: No edema.   Skin:     General: Skin is warm and dry.   Neurological:      Mental Status: She is alert and oriented to person, place, and time. Mental status is at baseline.      Cranial Nerves: No cranial nerve deficit.      Motor: No weakness.   Psychiatric:         Mood and Affect: Mood normal.         Behavior: Behavior normal.           DIAGNOSTIC  RESULTS     EKG: All EKG's are interpreted by the Emergency Department Physician who either signs or Co-signs this chart in the absence of a cardiologist.        RADIOLOGY:   Non-plain film images such as CT, Ultrasound and MRI are read by the radiologist. Plain radiographic images are visualized and preliminarily interpreted by the emergency physician as documented in ED course.      Interpretation per the Radiologist below, if available at the time of this note:    No orders to display        LABS:  Labs Reviewed   URINALYSIS WITH REFLEX TO CULTURE - Abnormal; Notable for the following components:       Result Value    Appearance HAZY (*)     Specific Gravity, UA >1.030 (*)     Protein, UA TRACE (*)     Ketones, Urine 15 (*)     Epithelial Cells, UA MODERATE (*)     BACTERIA, URINE 1+ (*)     Mucus, UA 2+ (*)     All other components within normal limits   CBC WITH AUTO DIFFERENTIAL - Abnormal; Notable for the following components:    Hemoglobin 10.9 (*)     MCV 76.6 (*)     MCH 23.6 (*)     RDW 14.9 (*)     Eosinophils % 2 (*)     All other components within normal limits   COMPREHENSIVE METABOLIC PANEL   LIPASE   BILIRUBIN, CONFIRMATORY   POC PREGNANCY UR-QUAL   POC PREGNANCY UR-QUAL       All other labs were within normal range or not returned as of this dictation.    EMERGENCY DEPARTMENT COURSE and DIFFERENTIAL DIAGNOSIS/MDM:   Vitals:    Vitals:    03/29/23 1632   BP: 139/81   Pulse: 99   Resp: 17   Temp: 99.3 F (37.4 C)   TempSrc: Oral   SpO2: 99%   Weight: 131.5 kg (290 lb)   Height: 1.727 m (5\' 8" )           Medical Decision Making  Patient presenting for evaluation of 1 month of nausea related to eating and intermittent headaches  Patient history notable for no longer has a gallbladder or appendix  Hemodynamically stable, no acute distress, soft nontender abdomen, no focal neurologic deficits  Patient reports mild headache, not sudden onset, not worst headache of her life, do not suspect subarachnoid  hemorrhage, no falls or trauma to suggest suspect intracranial injury, no stroke symptoms, no neck stiffness or meningeal signs, no fevers or chills, do not suspect encephalitis or meningitis.  Patient has no history of migraines, will give headache cocktail and reevaluate.  Patient has had nausea and vomiting related to eating ongoing for more than a month, GI cocktail given,  suspicious for GERD versus gastritis versus esophagitis versus duodenitis  Do not suspect small bowel obstruction as patient is not having pain and is having normal bowel movements.  No indication for abdominal imaging at this time as patient does not have abdominal pain, soft nontender abdomen, symptoms ongoing for more than 2 months.  Advised patient on gastroenterology follow-up, given as needed Zofran and PPI at discharge.  On reevaluation patient reports headache is resolved, reports nausea is resolved, reports that she is feeling well at this time, no further symptoms.  Will discharge with PPI and Zofran and advised to follow-up with PCP, and GI.  Strict turn precautions discussed    Amount and/or Complexity of Data Reviewed  Labs: ordered. Decision-making details documented in ED Course.    Risk  Prescription drug management.                REASSESSMENT     ED Course as of 03/29/23 2043   Mon Mar 29, 2023   1751 Ketones, Urine(!): 15 [SL]   1751 Nitrite, Urine: Negative [SL]   1751 Leukocyte Esterase, Urine: Negative [SL]   1751 WBC, UA: 0-4 [SL]   1751 Bacteria, UA(!): 1+  Not consistent with UTI [SL]   1751 Pregnancy, Urine: Negative [SL]   1751 Hemoglobin Quant(!): 10.9  Mild anemia, chronic anemia compared to prior [SL]   1751 WBC: 10.4 [SL]   1818 CMP:    Sodium 140   Potassium 4.5   Chloride 104   CARBON DIOXIDE 27   Anion Gap 9   Glucose 100   BUN,BUNPL 9   Creatinine 0.65   Bun/Cre 14   Est, Glom Filt Rate >90   Calcium 8.7   Total Bilirubin 0.2   ALT 16   AST 16   Alkaline Phosphatase 76   Total Protein 6.7   Albumin 3.7    Globulin 3.0   Albumin/Globulin Ratio 1.2 [SL]   1818 Creatinine: 0.65 [SL]   1818 Sodium: 140 [SL]   1818 Potassium: 4.5 [SL]   1818 Chloride: 104  Normal renal function, normal electrolytes [SL]   1818 Lipase: 36  No pancreatitis [SL]      ED Course User Index  [SL] Lenon Ahmadi, MD           CONSULTS:  None    PROCEDURES:  Unless otherwise noted below, none     Procedures          FINAL IMPRESSION      1. Nausea            DISPOSITION/PLAN   DISPOSITION Decision To Discharge 03/29/2023 08:31:33 PM      PATIENT REFERRED TO:  [facility]  [address]  [place] [address]  [phone]  Call today        DISCHARGE MEDICATIONS:  Discharge Medication List as of 03/29/2023  8:33 PM        START taking these medications    Details   omeprazole (PRILOSEC) 20 MG delayed release capsule Take 1 capsule by mouth every morning (before breakfast), Disp-30 capsule, R-0Normal      ondansetron (ZOFRAN-ODT) 4 MG disintegrating tablet Take 1 tablet by mouth 3 times daily as needed for Nausea or Vomiting, Disp-9 tablet, R-0Normal               (Please note that portions of this note were completed with a voice recognition program.  Efforts were made to edit the dictations but occasionally words are mis-transcribed.)  Lenon Ahmadi, MD (electronically signed)  Emergency Attending Physician               Lenon Ahmadi, MD  03/29/23 [phone]

## 2023-03-30 MED ORDER — OMEPRAZOLE 20 MG PO CPDR
20 MG | ORAL_CAPSULE | Freq: Every day | ORAL | 0 refills | Status: DC
Start: 2023-03-30 — End: 2023-07-05

## 2023-03-30 MED ORDER — ONDANSETRON 4 MG PO TBDP
4 | ORAL_TABLET | Freq: Three times a day (TID) | ORAL | 0 refills | Status: AC | PRN
Start: 2023-03-30 — End: 2023-04-01

## 2023-05-19 ENCOUNTER — Ambulatory Visit
Admit: 2023-05-19 | Discharge: 2023-05-19 | Payer: BLUE CROSS/BLUE SHIELD | Attending: Student in an Organized Health Care Education/Training Program

## 2023-05-19 DIAGNOSIS — R238 Other skin changes: Secondary | ICD-10-CM

## 2023-05-19 MED ORDER — INTERDRY 10"X36" EX SHEE
Freq: Every day | CUTANEOUS | 2 refills | Status: DC
Start: 2023-05-19 — End: 2023-07-05

## 2023-05-19 NOTE — Progress Notes (Signed)
Cristina Rios is a 27 y.o. female presents for a problem visit.    Chief Complaint   Patient presents with    vaginal rash     Patient's last menstrual period was 03/18/2023.  Birth Control: implant.  Last Pap: no record    The patient is reporting having: vaginal rash come and go. No discharge    Examination chaperoned by Sonnie Alamo, MA.

## 2023-05-19 NOTE — Progress Notes (Signed)
OB/GYN Problem VIsit    HPI  Cristina Rios is a G0P0000,  27 y.o. female who presents for a problem visit.     Here for persistent skin irritation in inguinal folds and lower abdominal fold. Puts baby powder there and not improved.    Also desires STI screening as she recently had a condom break. No known exposures, vaginal discharge or pelvic pain.    Wanted to discuss low sexual desire as well, chronic. No dyspareunia. Has nexplanon in place.     Past Medical History:   Diagnosis Date    Anemia 11/02/22    Depression 03/04/2015    Migraine 02/25/23     Past Surgical History:   Procedure Laterality Date    APPENDECTOMY N/A     CHOLECYSTECTOMY N/A     GASTRIC RESTRICTION SURGERY  02/20/2020    Gastric sleeve     Social History     Occupational History    Not on file   Tobacco Use    Smoking status: Never    Smokeless tobacco: Never   Vaping Use    Vaping status: Never Used   Substance and Sexual Activity    Alcohol use: Never    Drug use: Never    Sexual activity: Yes     Partners: Male     Birth control/protection: Implant     Family History   Problem Relation Age of Onset    Migraines Mother     Diabetes Maternal Grandfather     Stroke Maternal Grandfather     Bleeding Prob Maternal Grandmother     Diabetes Maternal Grandmother     Heart Surgery Maternal Grandmother     Diabetes Paternal Grandfather     Heart Surgery Paternal Grandfather     Stroke Paternal Grandfather     Heart Surgery Paternal Grandmother     Ovarian Cancer Paternal Grandmother        Allergies   Allergen Reactions    Adhesive Tape Itching and Rash    Lamotrigine Swelling     Joints become swollen.     Prior to Admission medications    Medication Sig Start Date End Date Taking? Authorizing Provider   Skin Protectants, Misc. (INTERDRY 10"X36") SHEE Apply 1 each topically daily 05/19/23  Yes Cristina Rios, Cristina Rieger, MD   omeprazole (PRILOSEC) 20 MG delayed release capsule Take 1 capsule by mouth every morning (before breakfast) 03/29/23   Cristina Ahmadi, MD         Review of Systems: History obtained from the patient  Constitutional: negative for weight loss, fever, night sweats  Breast: negative for breast lumps, nipple discharge, galactorrhea  GI: negative for change in bowel habits, abdominal pain, black or bloody stools  GU: negative for frequency, dysuria, hematuria, vaginal discharge  MSK: negative for back pain, joint pain, muscle pain  Skin: negative for itching, rash, hives  Psych: negative for anxiety, depression, change in mood      Objective:  BP 124/84   Pulse 88   Ht 1.727 m (5\' 8" )   Wt (!) 137.9 kg (304 lb)   LMP 03/18/2023 Comment: due to [facility], it's irregular  BMI 46.22 kg/m     Physical Exam:   PHYSICAL EXAMINATION    Constitutional  Appearance: well-nourished, well developed, alert, in no acute distress      Gastrointestinal  Abdominal Examination: abdomen non-tender to palpation, normal bowel sounds, no masses present  Liver and spleen: no hepatomegaly present, spleen not palpable  Hernias:  no hernias identified    Genitourinary  External Genitalia: normal appearance for age, no discharge present, no tenderness present, no inflammatory lesions present, no masses present, no atrophy present  Vagina: normal vaginal vault without central or paravaginal defects, no discharge present, no inflammatory lesions present, no masses present  Bladder: non-tender to palpation  Urethra: appears normal  Cervix: normal   Uterus: normal size, shape and consistency  Adnexa: no adnexal tenderness present, no adnexal masses present  Perineum: perineum within normal limits, no evidence of trauma, no rashes or skin lesions present  Anus: anus within normal limits, no hemorrhoids present  Inguinal Lymph Nodes: no lymphadenopathy present    Skin  Redness and some ulcerations beneath pannus fold and inguinal folds. No drainage, discharge.    Neurologic/Psychiatric  Mental Status:  Orientation: grossly oriented to person, place and time  Mood and Affect: mood normal, affect  appropriate      ASSESSMENT:    ICD-10-CM    1. Skin breakdown  R23.8 Skin Protectants, Misc. (INTERDRY 10"X36") SHEE     Chlamydia, Gonorrhea, Trichomoniasis     HIV 1/2 Ag/Ab, 4TH Generation,W Rflx Confirm     RPR     Hepatitis C Antibody     Hepatitis B Surface Antigen     RPR     Hepatitis B Surface Antigen     Hepatitis C Antibody     HIV 1/2 Ag/Ab, 4TH Generation,W Rflx Confirm      2. Vaginal discharge  N89.8 Chlamydia, Gonorrhea, Trichomoniasis     HIV 1/2 Ag/Ab, 4TH Generation,W Rflx Confirm     RPR     Hepatitis C Antibody     Hepatitis B Surface Antigen     RPR     Hepatitis B Surface Antigen     Hepatitis C Antibody     HIV 1/2 Ag/Ab, 4TH Generation,W Rflx Confirm          PLAN:  Orders Placed This Encounter    Chlamydia, Gonorrhea, Trichomoniasis    HIV 1/2 Ag/Ab, 4TH Generation,W Rflx Confirm     Standing Status:   Future     Number of Occurrences:   1     Standing Expiration Date:   05/18/2024    RPR     Standing Status:   Future     Number of Occurrences:   1     Standing Expiration Date:   05/18/2024    Hepatitis C Antibody     Standing Status:   Future     Number of Occurrences:   1     Standing Expiration Date:   05/18/2024    Hepatitis B Surface Antigen     Standing Status:   Future     Number of Occurrences:   1     Standing Expiration Date:   05/18/2024    Skin Protectants, Misc. (INTERDRY 10"X36") SHEE     Sig: Apply 1 each topically daily     Dispense:  1 each     Refill:  2     27yo G0 here for skin concerns, STI screening, and low sexual desire.  Discussed moisture-wicking fabric in the areas, use of desitin.   STI screening today.   Low sexual desire, chronic. Declines info on counselor. Discussed nexplanon could be influencing this, could consider switching to IUD.   RTO prn if symptoms persist or worsen.  Instructions given to pt.  Handouts given to pt.    Cristina Fiddler, MD  [facility]  OB/Gyn

## 2023-05-20 LAB — HEPATITIS C ANTIBODY
Hep C Ab Interp: NONREACTIVE
Hepatitis C Ab: 0.07 {index}

## 2023-05-20 LAB — HIV 1/2 AG/AB, 4TH GENERATION,W RFLX CONFIRM: HIV 1/2 Interp: NONREACTIVE

## 2023-05-20 LAB — HEPATITIS B SURFACE ANTIGEN
Hep B S Ag Interp: NEGATIVE
Hepatitis B Surface Ag: <0.1 {index}

## 2023-05-20 LAB — RPR: RPR: NONREACTIVE

## 2023-05-23 LAB — CHLAMYDIA, GONORRHEA, TRICHOMONIASIS
Chlamydia trachomatis, NAA: NEGATIVE
Neisseria Gonorrhoeae, NAA: NEGATIVE
Trichomonas Vaginalis by NAA: NEGATIVE

## 2023-07-05 ENCOUNTER — Ambulatory Visit: Admit: 2023-07-05 | Payer: BLUE CROSS/BLUE SHIELD | Admitting: Family Medicine | Primary: Family Medicine

## 2023-07-05 VITALS — BP 114/78 | HR 90 | Temp 97.40000°F | Resp 16 | Ht 68.0 in | Wt 293.3 lb

## 2023-07-05 DIAGNOSIS — J069 Acute upper respiratory infection, unspecified: Principal | ICD-10-CM

## 2023-07-05 MED ORDER — AMOXICILLIN-POT CLAVULANATE 875-125 MG PO TABS
875-125 | ORAL_TABLET | Freq: Two times a day (BID) | ORAL | 0 refills | Status: AC
Start: 2023-07-05 — End: 2023-07-12

## 2023-07-05 NOTE — Progress Notes (Signed)
 Chief Complaint   Patient presents with    Establish Care     Pt is here establishing care    New Patient    Cold Symptoms     Pt states that she has had a Upper respiratory infection for 2 wks states that her throat still hurts, dry mouth, cough and SOB s

## 2023-07-05 NOTE — Progress Notes (Signed)
 Cristina Rios is a 27 y.o. female who complains of   Chief Complaint   Patient presents with    Establish Care     Pt is here establishing care    New Patient    Cold Symptoms     Pt states that she has had a Upper respiratory infection for 2 wks states t

## 2023-07-06 ENCOUNTER — Encounter
Payer: BLUE CROSS/BLUE SHIELD | Attending: Student in an Organized Health Care Education/Training Program | Primary: Family Medicine

## 2023-07-14 ENCOUNTER — Encounter: Admit: 2023-07-14 | Admitting: Family Medicine

## 2023-07-14 DIAGNOSIS — N898 Other specified noninflammatory disorders of vagina: Secondary | ICD-10-CM

## 2023-07-14 MED ORDER — FLUCONAZOLE 150 MG PO TABS
150 | ORAL_TABLET | Freq: Once | ORAL | 0 refills | Status: AC
Start: 2023-07-14 — End: 2023-07-14

## 2023-08-06 ENCOUNTER — Encounter: Payer: BLUE CROSS/BLUE SHIELD | Attending: Otolaryngology | Primary: Family Medicine

## 2023-12-07 ENCOUNTER — Encounter: Payer: BLUE CROSS/BLUE SHIELD | Primary: Family Medicine
